# Patient Record
Sex: Female | Born: 1965 | Race: White | Hispanic: No | Marital: Married | Smoking: Never smoker
Health system: Southern US, Community
[De-identification: ages and names within clinical notes are randomized; demographics above are authoritative.]

## PROBLEM LIST (undated history)

## (undated) DIAGNOSIS — F32A Depression, unspecified: Secondary | ICD-10-CM

## (undated) DIAGNOSIS — D259 Leiomyoma of uterus, unspecified: Secondary | ICD-10-CM

## (undated) DIAGNOSIS — Z8742 Personal history of other diseases of the female genital tract: Secondary | ICD-10-CM

## (undated) DIAGNOSIS — I10 Essential (primary) hypertension: Secondary | ICD-10-CM

## (undated) DIAGNOSIS — N840 Polyp of corpus uteri: Secondary | ICD-10-CM

## (undated) DIAGNOSIS — L309 Dermatitis, unspecified: Secondary | ICD-10-CM

## (undated) DIAGNOSIS — G43909 Migraine, unspecified, not intractable, without status migrainosus: Secondary | ICD-10-CM

## (undated) DIAGNOSIS — F329 Major depressive disorder, single episode, unspecified: Secondary | ICD-10-CM

## (undated) HISTORY — DX: Major depressive disorder, single episode, unspecified: F32.9

## (undated) HISTORY — DX: Dermatitis, unspecified: L30.9

## (undated) HISTORY — DX: Depression, unspecified: F32.A

---

## 1988-07-23 HISTORY — PX: TONSILLECTOMY: SUR1361

## 1994-07-23 HISTORY — PX: APPENDECTOMY: SHX54

## 2002-07-23 DIAGNOSIS — D239 Other benign neoplasm of skin, unspecified: Secondary | ICD-10-CM

## 2002-07-23 HISTORY — DX: Other benign neoplasm of skin, unspecified: D23.9

## 2011-09-20 DIAGNOSIS — F329 Major depressive disorder, single episode, unspecified: Secondary | ICD-10-CM | POA: Insufficient documentation

## 2011-09-20 DIAGNOSIS — F32A Depression, unspecified: Secondary | ICD-10-CM | POA: Insufficient documentation

## 2014-04-06 DIAGNOSIS — L309 Dermatitis, unspecified: Secondary | ICD-10-CM | POA: Insufficient documentation

## 2015-05-18 ENCOUNTER — Encounter: Payer: Self-pay | Admitting: Physician Assistant

## 2015-05-18 ENCOUNTER — Ambulatory Visit (INDEPENDENT_AMBULATORY_CARE_PROVIDER_SITE_OTHER): Payer: BLUE CROSS/BLUE SHIELD | Admitting: Physician Assistant

## 2015-05-18 ENCOUNTER — Other Ambulatory Visit: Payer: Self-pay

## 2015-05-18 VITALS — BP 120/80 | HR 97 | Temp 98.5°F | Resp 16 | Ht 67.0 in | Wt 174.2 lb

## 2015-05-18 DIAGNOSIS — F32A Depression, unspecified: Secondary | ICD-10-CM

## 2015-05-18 DIAGNOSIS — E7800 Pure hypercholesterolemia, unspecified: Secondary | ICD-10-CM

## 2015-05-18 DIAGNOSIS — F329 Major depressive disorder, single episode, unspecified: Secondary | ICD-10-CM

## 2015-05-18 DIAGNOSIS — Z23 Encounter for immunization: Secondary | ICD-10-CM | POA: Diagnosis not present

## 2015-05-18 DIAGNOSIS — L309 Dermatitis, unspecified: Secondary | ICD-10-CM | POA: Diagnosis not present

## 2015-05-18 MED ORDER — VENLAFAXINE HCL ER 75 MG PO CP24: ORAL_CAPSULE | ORAL | Status: DC

## 2015-05-18 MED ORDER — TRIAMCINOLONE ACETONIDE 0.1 % EX OINT: 1.0000 "application " | TOPICAL_OINTMENT | Freq: Two times a day (BID) | CUTANEOUS | Status: DC

## 2015-05-18 NOTE — Progress Notes (Signed)
Patient ID: Kristi Dominguez, female   DOB: [birth year], 49 y.o.   MRN: [id number]  Name: Kristi Dominguez   MRN: [id number]    DOB: 10-01-1965   Date:05/18/2015       Progress Note  Subjective  Chief Complaint  Chief Complaint  Patient presents with  . Establish Care    Patient comes in office today to reestablish patient care, previous PCP was Dr. Hortencia Pilar. Patient request that we order Lipid panel today, she states that last time it was checked it was elevated and she just wants to rule it out given her family history of heart disease.Patient reports that her last pap smear and mammogram are up to date from 2015 and within normal limits.     HPI  Kristi Dominguez is a 49 year old female that comes to the office today to establish care. He states that she was previously seen for physical last year by Dr. Hortencia Pilar and had a normal Pap smear and pelvic exam. She states the only thing that was abnormal was that she did have elevated cholesterol when they checked her blood work. She would like to have her lipid panel checked again today. She does have a strong family history of CAD. She is not currently taking any cholesterol-lowering medication. She also states that she does not adhere to a healthy diet continuously due to schedules.  She would also like refills of her Effexor and her triamcinolone cream. She states that her dermatitis only flares in the winter. She states that she has had this for the last 10 years. The triamcinolone does help with the itch that she gets.  As for her depression she states that she does feel stable. She states that she still does have good days and bad days. She does have a fairly stressful life. Her middle son, Glennon Mac, was recently diagnosed with autism. He has been doing well since diagnosis but they do have good and bad days with him. She also states that her mother is in failing health and that her and her sisters used to try to take care of her. They  recently got home health to come in and help take care of her mother. This is relieved some stress but her mother does still try to get him to come see her at least once a day. She does have 2 other children as well, Apolonio Schneiders and Smackover, that have active lifestyle to produce a patent extracurricular activities. She states they're always busy running to practice and different appointments. This keeps her busy and she states that because of this they do not eat at home often. The fast food and things on the go. This is caused her to gain some weight which she thinks also exacerbates the depression some. She also does not exercise like she would like.  No problem-specific assessment & plan notes found for this encounter.   Past Medical History  Diagnosis Date  . Eczema   . Depression     Past Surgical History  Procedure Laterality Date  . Appendectomy  1996  . Tonsillectomy  1990  . Vaginal delivery  [phone]    Family History  Problem Relation Age of Onset  . Kidney failure Mother   . Hypertension Mother   . Hyperlipidemia Mother   . Heart attack Mother   . Obesity Mother   . Depression Mother   . Heart attack Father   . Hyperlipidemia Father   . Hypertension Father   . Depression  Father     Social History   Social History  . Marital Status: Married    Spouse Name: N/A  . Number of Children: N/A  . Years of Education: N/A   Occupational History  . Not on file.   Social History Main Topics  . Smoking status: Never Smoker   . Smokeless tobacco: Never Used  . Alcohol Use: No  . Drug Use: No  . Sexual Activity: Yes    Birth Control/ Protection: None   Other Topics Concern  . Not on file   Social History Narrative  . No narrative on file     Current outpatient prescriptions:  .  triamcinolone ointment (KENALOG) 0.1 %, Apply 1 application topically 2 (two) times daily., Disp: 80 g, Rfl: 3 .  venlafaxine XR (EFFEXOR-XR) 75 MG 24 hr capsule, TAKE ONE CAPSULE BY  MOUTH DAILY, Disp: 30 capsule, Rfl: 6  Allergies  Allergen Reactions  . Tetracycline Other (See Comments)     Review of Systems  Constitutional: Positive for malaise/fatigue.       Irritability  HENT: Negative.   Eyes: Negative.   Respiratory: Positive for cough. Negative for sputum production, shortness of breath and wheezing.   Cardiovascular: Negative.   Gastrointestinal: Negative.   Genitourinary: Negative.   Musculoskeletal: Negative.   Skin: Negative.   Neurological: Negative.   Endo/Heme/Allergies: Positive for environmental allergies.  Psychiatric/Behavioral: The patient is nervous/anxious and has insomnia.     Objective  Filed Vitals:   05/18/15 1434  BP: 120/80  Pulse: 97  Temp: 98.5 F (36.9 C)  TempSrc: Oral  Resp: 16  Height: 5\' 7"  (1.702 m)  Weight: 174 lb 3.2 oz (79.017 kg)    Physical Exam  Constitutional: She is oriented to person, place, and time and well-developed, well-nourished, and in no distress. No distress.  HENT:  Head: Normocephalic and atraumatic.  Right Ear: Hearing, tympanic membrane, external ear and ear canal normal.  Left Ear: Hearing, tympanic membrane, external ear and ear canal normal.  Nose: Nose normal.  Mouth/Throat: Uvula is midline, oropharynx is clear and moist and mucous membranes are normal. No oropharyngeal exudate.  Eyes: Conjunctivae are normal. Pupils are equal, round, and reactive to light. Right eye exhibits no discharge. Left eye exhibits no discharge. No scleral icterus.  Neck: Normal range of motion. Neck supple. No JVD present. No tracheal deviation present. No thyromegaly present.  Cardiovascular: Normal rate, regular rhythm, normal heart sounds and intact distal pulses.  Exam reveals no gallop and no friction rub.   No murmur heard. Pulmonary/Chest: Effort normal and breath sounds normal. No respiratory distress. She has no wheezes. She has no rales.  Musculoskeletal: Normal range of motion.  Lymphadenopathy:     She has no cervical adenopathy.  Neurological: She is alert and oriented to person, place, and time.  Skin: Skin is warm and dry. No rash noted. She is not diaphoretic.  Psychiatric: Mood, memory, affect and judgment normal.  Vitals reviewed.   No results found for this or any previous visit (from the past 2160 hour(s)).   Assessment & Plan  Problem List Items Addressed This Visit      Musculoskeletal and Integument   Dermatitis, eczematoid   Relevant Medications   triamcinolone ointment (KENALOG) 0.1 %   Other Relevant Orders   Comprehensive Metabolic Panel (CMET)     Other   Clinical depression   Relevant Medications   venlafaxine XR (EFFEXOR-XR) 75 MG 24 hr capsule   Other Relevant  Orders   TSH   CBC with Differential   Comprehensive Metabolic Panel (CMET)   Hypercholesterolemia   Relevant Orders   Lipid panel   Comprehensive Metabolic Panel (CMET)    Other Visit Diagnoses    Need for influenza vaccination    -  Primary    Relevant Orders    Flu Vaccine QUAD 36+ mos IM (Completed)       Meds ordered this encounter  Medications  . DISCONTD: venlafaxine XR (EFFEXOR-XR) 75 MG 24 hr capsule    Sig: Take by mouth.  . DISCONTD: triamcinolone ointment (KENALOG) 0.1 %    Sig: Apply 1 application topically 2 (two) times daily.  Marland Kitchen triamcinolone ointment (KENALOG) 0.1 %    Sig: Apply 1 application topically 2 (two) times daily.    Dispense:  80 g    Refill:  3  . venlafaxine XR (EFFEXOR-XR) 75 MG 24 hr capsule    Sig: TAKE ONE CAPSULE BY MOUTH DAILY    Dispense:  30 capsule    Refill:  6   Refilled her medications as above. We will also recheck cholesterol, CBC, CMP and thyroid. I will call her with the results of these labs. I will follow-up with her pending lab results or if all labs are stable I will follow-up with her in 6 months for recheck of her cholesterol and depression.

## 2015-05-18 NOTE — Patient Instructions (Signed)

## 2015-05-31 LAB — CBC WITH DIFFERENTIAL/PLATELET
BASOS: 1 %
Basophils Absolute: 0 10*3/uL (ref 0.0–0.2)
EOS (ABSOLUTE): 0.2 10*3/uL (ref 0.0–0.4)
EOS: 4 %
HEMATOCRIT: 41 % (ref 34.0–46.6)
HEMOGLOBIN: 13.9 g/dL (ref 11.1–15.9)
IMMATURE GRANULOCYTES: 0 %
Immature Grans (Abs): 0 10*3/uL (ref 0.0–0.1)
LYMPHS ABS: 1.5 10*3/uL (ref 0.7–3.1)
Lymphs: 34 %
MCH: 30.7 pg (ref 26.6–33.0)
MCHC: 33.9 g/dL (ref 31.5–35.7)
MCV: 91 fL (ref 79–97)
MONOCYTES: 9 %
Monocytes Absolute: 0.4 10*3/uL (ref 0.1–0.9)
Neutrophils Absolute: 2.3 10*3/uL (ref 1.4–7.0)
Neutrophils: 52 %
Platelets: 237 10*3/uL (ref 150–379)
RBC: 4.53 x10E6/uL (ref 3.77–5.28)
RDW: 13.7 % (ref 12.3–15.4)
WBC: 4.3 10*3/uL (ref 3.4–10.8)

## 2015-05-31 LAB — TSH: TSH: 2.04 u[IU]/mL (ref 0.450–4.500)

## 2015-05-31 LAB — COMPREHENSIVE METABOLIC PANEL
A/G RATIO: 1.6 (ref 1.1–2.5)
ALBUMIN: 3.9 g/dL (ref 3.5–5.5)
ALT: 9 IU/L (ref 0–32)
AST: 14 IU/L (ref 0–40)
Alkaline Phosphatase: 50 IU/L (ref 39–117)
BUN / CREAT RATIO: 18 (ref 9–23)
BUN: 14 mg/dL (ref 6–24)
Bilirubin Total: 0.2 mg/dL (ref 0.0–1.2)
CALCIUM: 8.9 mg/dL (ref 8.7–10.2)
CO2: 23 mmol/L (ref 18–29)
CREATININE: 0.77 mg/dL (ref 0.57–1.00)
Chloride: 104 mmol/L (ref 97–106)
GFR calc Af Amer: 106 mL/min/{1.73_m2} (ref >59)
GFR, EST NON AFRICAN AMERICAN: 92 mL/min/{1.73_m2} (ref >59)
GLOBULIN, TOTAL: 2.4 g/dL (ref 1.5–4.5)
Glucose: 94 mg/dL (ref 65–99)
Potassium: 4.6 mmol/L (ref 3.5–5.2)
SODIUM: 141 mmol/L (ref 136–144)
Total Protein: 6.3 g/dL (ref 6.0–8.5)

## 2015-05-31 LAB — LIPID PANEL
CHOLESTEROL TOTAL: 257 mg/dL — AB (ref 100–199)
Chol/HDL Ratio: 4.4 ratio units (ref 0.0–4.4)
HDL: 59 mg/dL (ref >39)
LDL CALC: 180 mg/dL — AB (ref 0–99)
TRIGLYCERIDES: 88 mg/dL (ref 0–149)
VLDL CHOLESTEROL CAL: 18 mg/dL (ref 5–40)

## 2015-06-01 ENCOUNTER — Telehealth: Payer: Self-pay

## 2015-06-01 NOTE — Telephone Encounter (Signed)
-----   Message from Mar Daring, PA-C sent at 05/31/2015  8:10 AM EST ----- Total cholesterol and LDL (bad) cholesterol are still elevated.  HDL (good) cholesterol is also elevated at 59 which offers some cardioprotection.  There is not a need for cholesterol lowering prescription medication at this time because of the HDL level.  However you may benefit from taking fish oil.  If you would like you could take 1200mg  fish oil twice daily.  Also continue physical activity.  This helps with keeping the HDL level elevated.  All other labs are stable and WNL.  We will recheck in one year.

## 2015-06-01 NOTE — Telephone Encounter (Signed)
Patient advised as directed below.  Thanks,  -Macil Crady 

## 2015-06-24 ENCOUNTER — Ambulatory Visit (INDEPENDENT_AMBULATORY_CARE_PROVIDER_SITE_OTHER): Payer: BLUE CROSS/BLUE SHIELD | Admitting: Family Medicine

## 2015-06-24 ENCOUNTER — Encounter: Payer: Self-pay | Admitting: Family Medicine

## 2015-06-24 VITALS — BP 114/80 | HR 73 | Temp 98.5°F | Resp 16 | Wt 174.2 lb

## 2015-06-24 DIAGNOSIS — R202 Paresthesia of skin: Secondary | ICD-10-CM | POA: Diagnosis not present

## 2015-06-24 NOTE — Patient Instructions (Signed)
Discussed taking two Aleve twice daily with food and a cockup wrist splint.

## 2015-06-24 NOTE — Progress Notes (Signed)
Subjective:     Patient ID: Kristi Dominguez, female   DOB: [birth year], 49 y.o.   MRN: [id number]  HPI  Chief Complaint  Patient presents with  . Numbness    Patient comes in office today with concerns with numbness in her left arm. Patient reports she began to experience numbness and tingling a week ago starting in her left hand at her 4th and 5th finger and radiating up fore arm. Patient reports that she did have a history of carpal tunnel in her hand.   States she is a full time mom of 3 children with the usual household activities. Denies specific repetitive motion injury she may have incurred. Does report increased stress as her 49 year old son was recently diagnosed with autism. Does not wish to change or add medication for stress at this time.   Review of Systems  Musculoskeletal:       No decreased strength in left hand.       Objective:   Physical Exam  Constitutional: She appears well-developed and well-nourished. No distress.  Musculoskeletal:  Grip strength 5/5 symmetrically. + Phalen's test for increased tingling in fourth and fifth fingers.       Assessment:    1. Paresthesias in left hand: probable ulnar tunnel syndrome    Plan:    Discussed use of  nsaid's and a cockup wrist splint.

## 2015-11-16 ENCOUNTER — Ambulatory Visit: Payer: BLUE CROSS/BLUE SHIELD | Admitting: Physician Assistant

## 2015-11-17 ENCOUNTER — Ambulatory Visit (INDEPENDENT_AMBULATORY_CARE_PROVIDER_SITE_OTHER): Payer: BLUE CROSS/BLUE SHIELD | Admitting: Physician Assistant

## 2015-11-17 ENCOUNTER — Encounter: Payer: Self-pay | Admitting: Physician Assistant

## 2015-11-17 VITALS — BP 120/70 | HR 73 | Temp 98.3°F | Resp 16 | Wt 173.4 lb

## 2015-11-17 DIAGNOSIS — E7800 Pure hypercholesterolemia, unspecified: Secondary | ICD-10-CM | POA: Diagnosis not present

## 2015-11-17 DIAGNOSIS — F329 Major depressive disorder, single episode, unspecified: Secondary | ICD-10-CM

## 2015-11-17 DIAGNOSIS — F32A Depression, unspecified: Secondary | ICD-10-CM

## 2015-11-17 MED ORDER — VENLAFAXINE HCL ER 75 MG PO CP24: ORAL_CAPSULE | ORAL | Status: DC

## 2015-11-17 NOTE — Progress Notes (Signed)
Patient: Kristi Dominguez Female    DOB: 07-May-1966   50 y.o.   MRN: [id number] Visit Date: 11/17/2015  Today's Provider: Mar Daring, PA-C   Chief Complaint  Patient presents with  . Follow-up    Depression and Hypercholesterolemia   Subjective:    HPI Depression: Patient here for 6 months follow-up depression. She complains of depressed mood sometimes and some Insomnia. She denies current suicidal and homicidal plan or intent. She complains of the following side effects from the treatment: none. Overall feels that increased exercising makes her feel better and sleep better. Current stressors remain associated with family issues. She stays busy with her oldest and youngest children and their extracurricular activities. Her middle son is autistic and OCD. They are in search of a developmental pediatrician.    Lipid/Cholesterol, Follow-up:   Last seen for this6 months ago.  Management changes since that visit include take fish oil twice daily. . Last Lipid Panel:    Component Value Date/Time   CHOL 257* 05/30/2015 0824   TRIG 88 05/30/2015 0824   HDL 59 05/30/2015 0824   CHOLHDL 4.4 05/30/2015 0824   LDLCALC 180* 05/30/2015 0824    She reports n/a compliance with treatment. She didn't do the fish oil. She is not having side effects.  Current symptoms include none. Weight trend: stable Prior visit with dietician: no Current diet: well balanced She doesn't drink any more sodas or sweet tea. Snacks are veggies, bars,or fruits. 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 She is a little concern that when she is exercising (jumping) she needs to wear a pad because her urine comes out.  Wt Readings from Last 3 Encounters:  11/17/15 173 lb 6.4 oz (78.654 kg)  06/24/15 174 lb 3.2 oz (79.017 kg)  05/18/15 174 lb 3.2 oz (79.017 kg)    -------------------------------------------------------------------     Allergies  Allergen  Reactions  . Tetracycline Other (See Comments)   Previous Medications   CETIRIZINE (ZYRTEC) 10 MG TABLET    Take 10 mg by mouth daily.   PROBIOTIC PRODUCT (TRUBIOTICS PO)    Take by mouth daily.   TRIAMCINOLONE OINTMENT (KENALOG) 0.1 %    Apply 1 application topically 2 (two) times daily.   VENLAFAXINE XR (EFFEXOR-XR) 75 MG 24 HR CAPSULE    TAKE ONE CAPSULE BY MOUTH DAILY    Review of Systems  Constitutional: Negative for fever and fatigue.  Respiratory: Negative for cough, chest tightness and shortness of breath.   Cardiovascular: Negative for chest pain, palpitations and leg swelling.  Gastrointestinal: Negative for nausea, vomiting and abdominal pain.  Endocrine: Positive for heat intolerance (from the Effexor).  Neurological: Negative for dizziness, light-headedness and headaches.  Psychiatric/Behavioral: Negative for suicidal ideas, sleep disturbance, self-injury, dysphoric mood and decreased concentration. The patient is not nervous/anxious.     Social History  Substance Use Topics  . Smoking status: Never Smoker   . Smokeless tobacco: Never Used  . Alcohol Use: No   Objective:   BP 120/70 mmHg  Pulse 73  Temp(Src) 98.3 F (36.8 C) (Oral)  Resp 16  Wt 173 lb 6.4 oz (78.654 kg)  LMP 10/31/2015  Physical Exam  Constitutional: She appears well-developed and well-nourished. No distress.  Neck: Normal range of motion. Neck supple.  Cardiovascular: Normal rate, regular rhythm and normal heart sounds.  Exam reveals no gallop and no friction rub.   No murmur heard. Pulmonary/Chest: Effort normal and  breath sounds normal. No respiratory distress. She has no wheezes. She has no rales.  Skin: She is not diaphoretic.  Psychiatric: She has a normal mood and affect. Her behavior is normal. Judgment and thought content normal.  Vitals reviewed.       Assessment & Plan:     1. Clinical depression Stable. Diagnosis pulled for medication refill. Continue current medical  treatment plan. She is to call if any worsening symptoms, acute issue, questions or concerns arise. - venlafaxine XR (EFFEXOR-XR) 75 MG 24 hr capsule; TAKE ONE CAPSULE BY MOUTH DAILY  Dispense: 30 capsule; Refill: 6  2. Hypercholesterolemia Stable. Improving diet and exercise. Will recheck in 6 months and also get CPE at that time as well.        Mar Daring, PA-C  [facility]

## 2015-11-17 NOTE — Patient Instructions (Signed)

## 2016-02-09 ENCOUNTER — Other Ambulatory Visit: Payer: Self-pay | Admitting: Physician Assistant

## 2016-02-09 DIAGNOSIS — F32A Depression, unspecified: Secondary | ICD-10-CM

## 2016-02-09 DIAGNOSIS — F329 Major depressive disorder, single episode, unspecified: Secondary | ICD-10-CM

## 2016-02-09 MED ORDER — VENLAFAXINE HCL ER 75 MG PO CP24: ORAL_CAPSULE | ORAL | Status: DC

## 2016-05-18 ENCOUNTER — Encounter: Payer: BLUE CROSS/BLUE SHIELD | Admitting: Physician Assistant

## 2016-05-22 ENCOUNTER — Encounter: Payer: Self-pay | Admitting: Physician Assistant

## 2016-05-22 ENCOUNTER — Ambulatory Visit (INDEPENDENT_AMBULATORY_CARE_PROVIDER_SITE_OTHER): Payer: BLUE CROSS/BLUE SHIELD | Admitting: Physician Assistant

## 2016-05-22 VITALS — BP 120/70 | HR 89 | Temp 98.2°F | Resp 16 | Ht 67.0 in | Wt 175.6 lb

## 2016-05-22 DIAGNOSIS — Z1239 Encounter for other screening for malignant neoplasm of breast: Secondary | ICD-10-CM

## 2016-05-22 DIAGNOSIS — F3341 Major depressive disorder, recurrent, in partial remission: Secondary | ICD-10-CM | POA: Diagnosis not present

## 2016-05-22 DIAGNOSIS — Z0000 Encounter for general adult medical examination without abnormal findings: Secondary | ICD-10-CM | POA: Diagnosis not present

## 2016-05-22 DIAGNOSIS — Z124 Encounter for screening for malignant neoplasm of cervix: Secondary | ICD-10-CM

## 2016-05-22 DIAGNOSIS — A09 Infectious gastroenteritis and colitis, unspecified: Secondary | ICD-10-CM

## 2016-05-22 DIAGNOSIS — R110 Nausea: Secondary | ICD-10-CM

## 2016-05-22 DIAGNOSIS — Z1231 Encounter for screening mammogram for malignant neoplasm of breast: Secondary | ICD-10-CM

## 2016-05-22 DIAGNOSIS — E7800 Pure hypercholesterolemia, unspecified: Secondary | ICD-10-CM

## 2016-05-22 DIAGNOSIS — Z23 Encounter for immunization: Secondary | ICD-10-CM | POA: Diagnosis not present

## 2016-05-22 DIAGNOSIS — G43009 Migraine without aura, not intractable, without status migrainosus: Secondary | ICD-10-CM | POA: Diagnosis not present

## 2016-05-22 MED ORDER — ONDANSETRON HCL 4 MG PO TABS: 4.0000 mg | ORAL_TABLET | Freq: Three times a day (TID) | ORAL | 0 refills | Status: DC | PRN

## 2016-05-22 MED ORDER — SUMATRIPTAN SUCCINATE 100 MG PO TABS: 100.0000 mg | ORAL_TABLET | ORAL | 0 refills | Status: DC | PRN

## 2016-05-22 MED ORDER — CIPROFLOXACIN HCL 500 MG PO TABS: 500.0000 mg | ORAL_TABLET | Freq: Every day | ORAL | 0 refills | Status: DC

## 2016-05-22 NOTE — Patient Instructions (Signed)
Health Maintenance, Female Adopting a healthy lifestyle and getting preventive care can go a long way to promote health and wellness. Talk with your health care provider about what schedule of regular examinations is right for you. This is a good chance for you to check in with your provider about disease prevention and staying healthy. In between checkups, there are plenty of things you can do on your own. Experts have done a lot of research about which lifestyle changes and preventive measures are most likely to keep you healthy. Ask your health care provider for more information. WEIGHT AND DIET  Eat a healthy diet  Be sure to include plenty of vegetables, fruits, low-fat dairy products, and lean protein.  Do not eat a lot of foods high in solid fats, added sugars, or salt.  Get regular exercise. This is one of the most important things you can do for your health.  Most adults should exercise for at least 150 minutes each week. The exercise should increase your heart rate and make you sweat (moderate-intensity exercise).  Most adults should also do strengthening exercises at least twice a week. This is in addition to the moderate-intensity exercise.  Maintain a healthy weight  Body mass index (BMI) is a measurement that can be used to identify possible weight problems. It estimates body fat based on height and weight. Your health care provider can help determine your BMI and help you achieve or maintain a healthy weight.  For females 20 years of age and older:   A BMI below 18.5 is considered underweight.  A BMI of 18.5 to 24.9 is normal.  A BMI of 25 to 29.9 is considered overweight.  A BMI of 30 and above is considered obese.  Watch levels of cholesterol and blood lipids  You should start having your blood tested for lipids and cholesterol at 49 years of age, then have this test every 5 years.  You may need to have your cholesterol levels checked more often if:  Your lipid  or cholesterol levels are high.  You are older than 50 years of age.  You are at high risk for heart disease.  CANCER SCREENING   Lung Cancer  Lung cancer screening is recommended for adults 55-80 years old who are at high risk for lung cancer because of a history of smoking.  A yearly low-dose CT scan of the lungs is recommended for people who:  Currently smoke.  Have quit within the past 15 years.  Have at least a 30-pack-year history of smoking. A pack year is smoking an average of one pack of cigarettes a day for 1 year.  Yearly screening should continue until it has been 15 years since you quit.  Yearly screening should stop if you develop a health problem that would prevent you from having lung cancer treatment.  Breast Cancer  Practice breast self-awareness. This means understanding how your breasts normally appear and feel.  It also means doing regular breast self-exams. Let your health care provider know about any changes, no matter how small.  If you are in your 20s or 30s, you should have a clinical breast exam (CBE) by a health care provider every 1-3 years as part of a regular health exam.  If you are 40 or older, have a CBE every year. Also consider having a breast X-ray (mammogram) every year.  If you have a family history of breast cancer, talk to your health care provider about genetic screening.  If you   are at high risk for breast cancer, talk to your health care provider about having an MRI and a mammogram every year.  Breast cancer gene (BRCA) assessment is recommended for women who have family members with BRCA-related cancers. BRCA-related cancers include:  Breast.  Ovarian.  Tubal.  Peritoneal cancers.  Results of the assessment will determine the need for genetic counseling and BRCA1 and BRCA2 testing. Cervical Cancer Your health care provider may recommend that you be screened regularly for cancer of the pelvic organs (ovaries, uterus, and  vagina). This screening involves a pelvic examination, including checking for microscopic changes to the surface of your cervix (Pap test). You may be encouraged to have this screening done every 3 years, beginning at age 21.  For women ages 30-65, health care providers may recommend pelvic exams and Pap testing every 3 years, or they may recommend the Pap and pelvic exam, combined with testing for human papilloma virus (HPV), every 5 years. Some types of HPV increase your risk of cervical cancer. Testing for HPV may also be done on women of any age with unclear Pap test results.  Other health care providers may not recommend any screening for nonpregnant women who are considered low risk for pelvic cancer and who do not have symptoms. Ask your health care provider if a screening pelvic exam is right for you.  If you have had past treatment for cervical cancer or a condition that could lead to cancer, you need Pap tests and screening for cancer for at least 20 years after your treatment. If Pap tests have been discontinued, your risk factors (such as having a new sexual partner) need to be reassessed to determine if screening should resume. Some women have medical problems that increase the chance of getting cervical cancer. In these cases, your health care provider may recommend more frequent screening and Pap tests. Colorectal Cancer  This type of cancer can be detected and often prevented.  Routine colorectal cancer screening usually begins at 50 years of age and continues through 49 years of age.  Your health care provider may recommend screening at an earlier age if you have risk factors for colon cancer.  Your health care provider may also recommend using home test kits to check for hidden blood in the stool.  A small camera at the end of a tube can be used to examine your colon directly (sigmoidoscopy or colonoscopy). This is done to check for the earliest forms of colorectal  cancer.  Routine screening usually begins at age 50.  Direct examination of the colon should be repeated every 5-10 years through 49 years of age. However, you may need to be screened more often if early forms of precancerous polyps or small growths are found. Skin Cancer  Check your skin from head to toe regularly.  Tell your health care provider about any new moles or changes in moles, especially if there is a change in a mole's shape or color.  Also tell your health care provider if you have a mole that is larger than the size of a pencil eraser.  Always use sunscreen. Apply sunscreen liberally and repeatedly throughout the day.  Protect yourself by wearing long sleeves, pants, a wide-brimmed hat, and sunglasses whenever you are outside. HEART DISEASE, DIABETES, AND HIGH BLOOD PRESSURE   High blood pressure causes heart disease and increases the risk of stroke. High blood pressure is more likely to develop in:  People who have blood pressure in the high end   of the normal range (130-139/85-89 mm Hg).  People who are overweight or obese.  People who are African American.  If you are 38-23 years of age, have your blood pressure checked every 3-5 years. If you are 61 years of age or older, have your blood pressure checked every year. You should have your blood pressure measured twice--once when you are at a hospital or clinic, and once when you are not at a hospital or clinic. Record the average of the two measurements. To check your blood pressure when you are not at a hospital or clinic, you can use:  An automated blood pressure machine at a pharmacy.  A home blood pressure monitor.  If you are between 45 years and 39 years old, ask your health care provider if you should take aspirin to prevent strokes.  Have regular diabetes screenings. This involves taking a blood sample to check your fasting blood sugar level.  If you are at a normal weight and have a low risk for diabetes,  have this test once every three years after 49 years of age.  If you are overweight and have a high risk for diabetes, consider being tested at a younger age or more often. PREVENTING INFECTION  Hepatitis B  If you have a higher risk for hepatitis B, you should be screened for this virus. You are considered at high risk for hepatitis B if:  You were born in a country where hepatitis B is common. Ask your health care provider which countries are considered high risk.  Your parents were born in a high-risk country, and you have not been immunized against hepatitis B (hepatitis B vaccine).  You have HIV or AIDS.  You use needles to inject street drugs.  You live with someone who has hepatitis B.  You have had sex with someone who has hepatitis B.  You get hemodialysis treatment.  You take certain medicines for conditions, including cancer, organ transplantation, and autoimmune conditions. Hepatitis C  Blood testing is recommended for:  Everyone born from [birth year] through 1964.  Anyone with known risk factors for hepatitis C. Sexually transmitted infections (STIs)  You should be screened for sexually transmitted infections (STIs) including gonorrhea and chlamydia if:  You are sexually active and are younger than 49 years of age.  You are older than 49 years of age and your health care provider tells you that you are at risk for this type of infection.  Your sexual activity has changed since you were last screened and you are at an increased risk for chlamydia or gonorrhea. Ask your health care provider if you are at risk.  If you do not have HIV, but are at risk, it may be recommended that you take a prescription medicine daily to prevent HIV infection. This is called pre-exposure prophylaxis (PrEP). You are considered at risk if:  You are sexually active and do not regularly use condoms or know the HIV status of your partner(s).  You take drugs by injection.  You are sexually  active with a partner who has HIV. Talk with your health care provider about whether you are at high risk of being infected with HIV. If you choose to begin PrEP, you should first be tested for HIV. You should then be tested every 3 months for as long as you are taking PrEP.  PREGNANCY   If you are premenopausal and you may become pregnant, ask your health care provider about preconception counseling.  If you may  become pregnant, take 400 to 800 micrograms (mcg) of folic acid every day.  If you want to prevent pregnancy, talk to your health care provider about birth control (contraception). OSTEOPOROSIS AND MENOPAUSE   Osteoporosis is a disease in which the bones lose minerals and strength with aging. This can result in serious bone fractures. Your risk for osteoporosis can be identified using a bone density scan.  If you are 50 years of age or older, or if you are at risk for osteoporosis and fractures, ask your health care provider if you should be screened.  Ask your health care provider whether you should take a calcium or vitamin D supplement to lower your risk for osteoporosis.  Menopause may have certain physical symptoms and risks.  Hormone replacement therapy may reduce some of these symptoms and risks. Talk to your health care provider about whether hormone replacement therapy is right for you.  HOME CARE INSTRUCTIONS   Schedule regular health, dental, and eye exams.  Stay current with your immunizations.   Do not use any tobacco products including cigarettes, chewing tobacco, or electronic cigarettes.  If you are pregnant, do not drink alcohol.  If you are breastfeeding, limit how much and how often you drink alcohol.  Limit alcohol intake to no more than 1 drink per day for nonpregnant women. One drink equals 12 ounces of beer, 5 ounces of wine, or 1 ounces of hard liquor.  Do not use street drugs.  Do not share needles.  Ask your health care provider for help if  you need support or information about quitting drugs.  Tell your health care provider if you often feel depressed.  Tell your health care provider if you have ever been abused or do not feel safe at home.   This information is not intended to replace advice given to you by your health care provider. Make sure you discuss any questions you have with your health care provider.   Document Released: 01/22/2011 Document Revised: 07/30/2014 Document Reviewed: 06/10/2013 Elsevier Interactive Patient Education [employer].

## 2016-05-22 NOTE — Progress Notes (Signed)
Patient: Kristi Dominguez, Female    DOB: October 25, 1965, 50 y.o.   MRN: [id number] Visit Date: 05/22/2016  Today's Provider: Mar Daring, PA-C   Chief Complaint  Patient presents with  . Annual Exam   Subjective:    Annual physical exam Kristi Dominguez is a 50 y.o. female who presents today for health maintenance and complete physical. She feels well. She reports exercising. She reports she is sleeping well.  She reports pap 3 years ago at previous PCP and normal. Mammogram: 2015 normal. Uses [facility].  She is traveling with her family to [place] on Jun 03, 2016. She is requesting nausea/diarrhea medicine and any specific immunizations that she might need. -----------------------------------------------------------------   Review of Systems  Constitutional: Negative.        Stressed out  HENT: Negative.   Eyes: Negative.   Respiratory: Positive for cough (persistant;question allergies).   Cardiovascular: Negative.   Gastrointestinal: Negative.   Endocrine: Negative.   Genitourinary: Positive for enuresis (during exercise).  Musculoskeletal: Negative.        Sore spot on Left foot  Skin: Negative.   Allergic/Immunologic: Negative.   Neurological: Negative.   Hematological: Negative.   Psychiatric/Behavioral: Negative.     Social History      She  reports that she has never smoked. She has never used smokeless tobacco. She reports that she does not drink alcohol or use drugs.       Social History   Social History  . Marital status: Married    Spouse name: N/A  . Number of children: N/A  . Years of education: N/A   Social History Main Topics  . Smoking status: Never Smoker  . Smokeless tobacco: Never Used  . Alcohol use No  . Drug use: No  . Sexual activity: Yes    Birth control/ protection: None   Other Topics Concern  . None   Social History Narrative  . None    Past Medical History:  Diagnosis Date  . Depression   .  Eczema      Patient Active Problem List   Diagnosis Date Noted  . Hypercholesterolemia 05/18/2015  . Dermatitis, eczematoid 04/06/2014  . Clinical depression 09/20/2011    Past Surgical History:  Procedure Laterality Date  . APPENDECTOMY  1996  . TONSILLECTOMY  1990  . VAGINAL DELIVERY  2004,2006,2008    Family History        Family Status  Relation Status  . Mother Alive  . Father Alive        Her family history includes Depression in her father and mother; Heart attack in her father and mother; Hyperlipidemia in her father and mother; Hypertension in her father and mother; Kidney failure in her mother; Obesity in her mother.    Allergies  Allergen Reactions  . Tetracycline Other (See Comments)    Current Meds  Medication Sig  . cetirizine (ZYRTEC) 10 MG tablet Take 10 mg by mouth daily.  . Probiotic Product (TRUBIOTICS PO) Take by mouth daily.  Marland Kitchen triamcinolone ointment (KENALOG) 0.1 % Apply 1 application topically 2 (two) times daily.  Marland Kitchen venlafaxine XR (EFFEXOR-XR) 75 MG 24 hr capsule TAKE ONE CAPSULE BY MOUTH DAILY    Patient Care Team: Mar Daring, PA-C as PCP - General (Family Medicine)     Objective:   Vitals: BP 120/70 (BP Location: Right Arm, Patient Position: Sitting, Cuff Size: Normal)   Pulse 89   Temp 98.2  F (36.8 C) (Oral)   Resp 16   Ht 5\' 7"  (1.702 m)   Wt 175 lb 9.6 oz (79.7 kg)   BMI 27.50 kg/m    Physical Exam  Constitutional: She is oriented to person, place, and time. She appears well-developed and well-nourished. No distress.  HENT:  Head: Normocephalic and atraumatic.  Right Ear: Hearing, tympanic membrane, external ear and ear canal normal.  Left Ear: Hearing, tympanic membrane, external ear and ear canal normal.  Nose: Nose normal.  Mouth/Throat: Uvula is midline, oropharynx is clear and moist and mucous membranes are normal. No oropharyngeal exudate.  Eyes: Conjunctivae and EOM are normal. Pupils are equal, round, and  reactive to light. Right eye exhibits no discharge. Left eye exhibits no discharge. No scleral icterus.  Neck: Normal range of motion. Neck supple. No JVD present. Carotid bruit is not present. No tracheal deviation present. No thyromegaly present.  Cardiovascular: Normal rate, regular rhythm, normal heart sounds and intact distal pulses.  Exam reveals no gallop and no friction rub.   No murmur heard. Pulmonary/Chest: Effort normal and breath sounds normal. No respiratory distress. She has no wheezes. She has no rales. She exhibits no tenderness. Right breast exhibits no inverted nipple, no mass, no nipple discharge, no skin change and no tenderness. Left breast exhibits no inverted nipple, no mass, no nipple discharge, no skin change and no tenderness. Breasts are symmetrical.  Abdominal: Soft. Bowel sounds are normal. She exhibits no distension and no mass. There is no tenderness. There is no rebound and no guarding. Hernia confirmed negative in the right inguinal area and confirmed negative in the left inguinal area.  Genitourinary: Rectum normal, vagina normal and uterus normal. No breast swelling, tenderness, discharge or bleeding. Pelvic exam was performed with patient supine. There is no rash, tenderness, lesion or injury on the right labia. There is no rash, tenderness, lesion or injury on the left labia. Cervix exhibits no motion tenderness, no discharge and no friability. Right adnexum displays no mass, no tenderness and no fullness. Left adnexum displays no mass, no tenderness and no fullness. No erythema, tenderness or bleeding in the vagina. No signs of injury around the vagina. No vaginal discharge found.  Musculoskeletal: Normal range of motion. She exhibits no edema or tenderness.  Lymphadenopathy:    She has no cervical adenopathy.       Right: No inguinal adenopathy present.       Left: No inguinal adenopathy present.  Neurological: She is alert and oriented to person, place, and time.  She has normal reflexes. No cranial nerve deficit. Coordination normal.  Skin: Skin is warm and dry. No rash noted. She is not diaphoretic.  Psychiatric: She has a normal mood and affect. Her behavior is normal. Judgment and thought content normal.  Vitals reviewed.   Depression Screen No flowsheet data found.    Assessment & Plan:     Routine Health Maintenance and Physical Exam  Exercise Activities and Dietary recommendations Goals    None      Immunization History  Administered Date(s) Administered  . Influenza,inj,Quad PF,36+ Mos 05/18/2015    Health Maintenance  Topic Date Due  . HIV Screening  05/13/1982  . TETANUS/TDAP  05/13/1986  . PAP SMEAR  05/13/1988  . INFLUENZA VACCINE  02/21/2016      Discussed health benefits of physical activity, and encouraged her to engage in regular exercise appropriate for her age and condition.   1. Annual physical exam Normal physical exam today.  Will check labs as below and f/u pending lab results. If labs are stable and WNL she will not need to have these rechecked for one year at her next annual physical exam. She is to call the office in the meantime if she has any acute issue, questions or concerns. - CBC with Differential/Platelet - Comprehensive metabolic panel - TSH  2. Cervical cancer screening Pap collected today. Will send as below and f/u pending results. - Pap IG and HPV (high risk) DNA detection  3. Breast cancer screening Breast exam today was normal. There is no family history of breast cancer. She does perform regular self breast exams. Mammogram was ordered as below. Advised patient to call [facility] so she may schedule her mammogram at her convenience. - MM DIGITAL SCREENING BILATERAL; Future  4. Hypercholesterolemia Diet controlled. Will check labs as below and f/u pending results. - Lipid panel  5. Recurrent major depressive disorder, in partial remission (HCC) Stable on venlafaxine. Most of  stress stems from family stress.  6. Need for tetanus booster Tdap Vaccine given to patient without complications. Patient sat for 15 minutes after administration and was tolerated well without adverse effects. - Tdap vaccine greater than or equal to 7yo IM  7. Need for influenza vaccination Flu vaccine given today without complication. Patient sat upright for 15 minutes to check for adverse reaction before being released. - Flu Vaccine QUAD 36+ mos PF IM (Fluarix & Fluzone Quad PF)  8. Traveler's diarrhea Prophylaxis cipro from traveler's diarrhea for upcoming trip to [place] given to patient. - ciprofloxacin (CIPRO) 500 MG tablet; Take 1 tablet (500 mg total) by mouth daily with breakfast.  Dispense: 7 tablet; Refill: 0  9. Migraine without aura and without status migrainosus, not intractable H/O migraines. Stable. Diagnosis pulled for medication refill. Continue current medical treatment plan. - SUMAtriptan (IMITREX) 100 MG tablet; Take 1 tablet (100 mg total) by mouth every 2 (two) hours as needed for migraine. May repeat in 2 hours if headache persists or recurs.  Dispense: 10 tablet; Refill: 0  10. Nausea Zofran given for possible nausea from travel. - ondansetron (ZOFRAN) 4 MG tablet; Take 1 tablet (4 mg total) by mouth every 8 (eight) hours as needed for nausea or vomiting.  Dispense: 10 tablet; Refill: 0  --------------------------------------------------------------------    Mar Daring, PA-C  [facility]

## 2016-05-27 LAB — PAP IG AND HPV HIGH-RISK
HPV, high-risk: NEGATIVE
PAP Smear Comment: 0

## 2016-05-28 ENCOUNTER — Telehealth: Payer: Self-pay

## 2016-05-28 NOTE — Telephone Encounter (Signed)
LMTCB-KW 

## 2016-05-28 NOTE — Telephone Encounter (Signed)
Patient has been advised of lab. KW

## 2016-05-28 NOTE — Telephone Encounter (Signed)
-----   Message from Mar Daring, PA-C sent at 05/28/2016  8:43 AM EST ----- Pap is normal and HPV negative. Normally would repeat in 5 years.

## 2016-05-30 ENCOUNTER — Telehealth: Payer: Self-pay

## 2016-05-30 LAB — LIPID PANEL
CHOL/HDL RATIO: 4.1 ratio (ref 0.0–4.4)
Cholesterol, Total: 292 mg/dL — ABNORMAL HIGH (ref 100–199)
HDL: 72 mg/dL (ref >39)
LDL Calculated: 198 mg/dL — ABNORMAL HIGH (ref 0–99)
TRIGLYCERIDES: 109 mg/dL (ref 0–149)
VLDL Cholesterol Cal: 22 mg/dL (ref 5–40)

## 2016-05-30 LAB — COMPREHENSIVE METABOLIC PANEL
A/G RATIO: 1.7 (ref 1.2–2.2)
ALT: 11 IU/L (ref 0–32)
AST: 15 IU/L (ref 0–40)
Albumin: 4.4 g/dL (ref 3.5–5.5)
Alkaline Phosphatase: 56 IU/L (ref 39–117)
BUN/Creatinine Ratio: 18 (ref 9–23)
BUN: 14 mg/dL (ref 6–24)
Bilirubin Total: 0.2 mg/dL (ref 0.0–1.2)
CALCIUM: 9.2 mg/dL (ref 8.7–10.2)
CO2: 25 mmol/L (ref 18–29)
Chloride: 99 mmol/L (ref 96–106)
Creatinine, Ser: 0.77 mg/dL (ref 0.57–1.00)
GFR calc Af Amer: 105 mL/min/{1.73_m2} (ref >59)
GFR, EST NON AFRICAN AMERICAN: 91 mL/min/{1.73_m2} (ref >59)
GLOBULIN, TOTAL: 2.6 g/dL (ref 1.5–4.5)
Glucose: 84 mg/dL (ref 65–99)
POTASSIUM: 4.5 mmol/L (ref 3.5–5.2)
SODIUM: 143 mmol/L (ref 134–144)
Total Protein: 7 g/dL (ref 6.0–8.5)

## 2016-05-30 LAB — CBC WITH DIFFERENTIAL/PLATELET
BASOS: 0 %
Basophils Absolute: 0 10*3/uL (ref 0.0–0.2)
EOS (ABSOLUTE): 0.3 10*3/uL (ref 0.0–0.4)
EOS: 6 %
HEMATOCRIT: 41.5 % (ref 34.0–46.6)
Hemoglobin: 14.3 g/dL (ref 11.1–15.9)
IMMATURE GRANULOCYTES: 0 %
Immature Grans (Abs): 0 10*3/uL (ref 0.0–0.1)
LYMPHS ABS: 2.2 10*3/uL (ref 0.7–3.1)
Lymphs: 43 %
MCH: 31.3 pg (ref 26.6–33.0)
MCHC: 34.5 g/dL (ref 31.5–35.7)
MCV: 91 fL (ref 79–97)
MONOS ABS: 0.5 10*3/uL (ref 0.1–0.9)
Monocytes: 10 %
NEUTROS ABS: 2.1 10*3/uL (ref 1.4–7.0)
Neutrophils: 41 %
Platelets: 258 10*3/uL (ref 150–379)
RBC: 4.57 x10E6/uL (ref 3.77–5.28)
RDW: 13.9 % (ref 12.3–15.4)
WBC: 5.1 10*3/uL (ref 3.4–10.8)

## 2016-05-30 LAB — TSH: TSH: 2.14 u[IU]/mL (ref 0.450–4.500)

## 2016-05-30 NOTE — Telephone Encounter (Signed)
-----   Message from Mar Daring, [place] sent at 05/30/2016  8:09 AM EST ----- All labs are WNL and stable with exception of cholesterol which is elevated. Your good cholesterol has improved from last year however which offers great cardioprotection. Continue healthy lifestyle modifications.

## 2016-05-30 NOTE — Telephone Encounter (Signed)
Patient has been advised. KW 

## 2016-05-30 NOTE — Telephone Encounter (Signed)
LMTCB

## 2016-08-07 ENCOUNTER — Encounter: Payer: Self-pay | Admitting: Physician Assistant

## 2016-08-07 ENCOUNTER — Ambulatory Visit (INDEPENDENT_AMBULATORY_CARE_PROVIDER_SITE_OTHER): Payer: Managed Care, Other (non HMO) | Admitting: Physician Assistant

## 2016-08-07 VITALS — BP 110/70 | HR 77 | Temp 98.0°F | Resp 16 | Wt 181.0 lb

## 2016-08-07 DIAGNOSIS — Z803 Family history of malignant neoplasm of breast: Secondary | ICD-10-CM

## 2016-08-07 DIAGNOSIS — N63 Unspecified lump in unspecified breast: Secondary | ICD-10-CM

## 2016-08-07 NOTE — Patient Instructions (Signed)
Mammogram A mammogram is an X-ray of the breasts that is done to check for abnormal changes. This procedure can screen for and detect any changes that may suggest breast cancer. A mammogram can also identify other changes and variations in the breast, such as:  Inflammation of the breast tissue (mastitis).  An infected area that contains a collection of pus (abscess).  A fluid-filled sac (cyst).  Fibrocystic changes. This is when breast tissue becomes denser, which can make the tissue feel rope-like or uneven under the skin.  Tumors that are not cancerous (benign).  Tell a health care provider about:  Any allergies you have.  If you have breast implants.  If you have had previous breast disease, biopsy, or surgery.  If you are breastfeeding.  Any possibility that you could be pregnant, if this applies.  If you are younger than age 25.  If you have a family history of breast cancer. What are the risks? Generally, this is a safe procedure. However, problems may occur, including:  Exposure to radiation. Radiation levels are very low with this test.  The results being misinterpreted.  The need for further tests.  The inability of the mammogram to detect certain cancers.  What happens before the procedure?  Schedule your test about 1-2 weeks after your menstrual period. This is usually when your breasts are the least tender.  If you have had a mammogram done at a different facility in the past, get the mammogram X-rays or have them sent to your current exam facility in order to compare them.  Wash your breasts and under your arms the day of the test.  Do not wear deodorants, perfumes, lotions, or powders anywhere on your body on the day of the test.  Remove any jewelry from your neck.  Wear clothes that you can change into and out of easily. What happens during the procedure?  You will undress from the waist up and put on a gown.  You will stand in front of the  X-ray machine.  Each breast will be placed between two plastic or glass plates. The plates will compress your breast for a few seconds. Try to stay as relaxed as possible during the procedure. This does not cause any harm to your breasts and any discomfort you feel will be very brief.  X-rays will be taken from different angles of each breast. The procedure may vary among health care providers and hospitals. What happens after the procedure?  The mammogram will be examined by a specialist (radiologist).  You may need to repeat certain parts of the test, depending on the quality of the images. This is commonly done if the radiologist needs a better view of the breast tissue.  Ask when your test results will be ready. Make sure you get your test results.  You may resume your normal activities. This information is not intended to replace advice given to you by your health care provider. Make sure you discuss any questions you have with your health care provider. Document Released: 07/06/2000 Document Revised: 12/12/2015 Document Reviewed: 09/17/2014 Elsevier Interactive Patient Education  2017 Elsevier Inc.  

## 2016-08-07 NOTE — Progress Notes (Signed)
Patient: Kristi Dominguez Female    DOB: May 28, 1966   51 y.o.   MRN: [id number] Visit Date: 08/07/2016  Today's Provider: Mar Daring, PA-C   Chief Complaint  Patient presents with  . Lump on right breast   Subjective:    HPI  Patient is here with c/o lump on the right breast. Requesting mammogram referral. She reports that her breast aches. It doesn't hurt to touch. Symptoms started around Christmas time.  Family history of breast cancer. Denies skin changes, asymmetry, nipple discharge.     Allergies  Allergen Reactions  . Tetracycline Other (See Comments)     Current Outpatient Prescriptions:  .  cetirizine (ZYRTEC) 10 MG tablet, Take 10 mg by mouth daily., Disp: , Rfl:  .  Probiotic Product (TRUBIOTICS PO), Take by mouth daily., Disp: , Rfl:  .  SUMAtriptan (IMITREX) 100 MG tablet, Take 1 tablet (100 mg total) by mouth every 2 (two) hours as needed for migraine. May repeat in 2 hours if headache persists or recurs., Disp: 10 tablet, Rfl: 0 .  triamcinolone ointment (KENALOG) 0.1 %, Apply 1 application topically 2 (two) times daily., Disp: 80 g, Rfl: 3 .  venlafaxine XR (EFFEXOR-XR) 75 MG 24 hr capsule, TAKE ONE CAPSULE BY MOUTH DAILY, Disp: 90 capsule, Rfl: 3 .  ciprofloxacin (CIPRO) 500 MG tablet, Take 1 tablet (500 mg total) by mouth daily with breakfast. (Patient not taking: Reported on 08/07/2016), Disp: 7 tablet, Rfl: 0 .  ondansetron (ZOFRAN) 4 MG tablet, Take 1 tablet (4 mg total) by mouth every 8 (eight) hours as needed for nausea or vomiting. (Patient not taking: Reported on 08/07/2016), Disp: 10 tablet, Rfl: 0  Review of Systems  Constitutional: Negative.   Respiratory: Negative.   Cardiovascular: Negative.   Gastrointestinal: Negative.   Neurological: Negative.   Psychiatric/Behavioral: Negative.     Social History  Substance Use Topics  . Smoking status: Never Smoker  . Smokeless tobacco: Never Used  . Alcohol use No   Objective:   BP  110/70 (BP Location: Right Arm, Patient Position: Sitting, Cuff Size: Normal)   Pulse 77   Temp 98 F (36.7 C) (Oral)   Resp 16   Wt 181 lb (82.1 kg)   BMI 28.35 kg/m   Physical Exam  Constitutional: She appears well-developed and well-nourished. No distress.  Neck: Normal range of motion. Neck supple.  Cardiovascular: Normal rate, regular rhythm and normal heart sounds.  Exam reveals no gallop and no friction rub.   No murmur heard. Pulmonary/Chest: Effort normal and breath sounds normal. No respiratory distress. She has no wheezes. She has no rales. Right breast exhibits mass and tenderness. Right breast exhibits no inverted nipple, no nipple discharge and no skin change. Left breast exhibits no inverted nipple, no mass, no nipple discharge, no skin change and no tenderness. Breasts are symmetrical.    Skin: She is not diaphoretic.  Vitals reviewed.      Assessment & Plan:     1. Breast mass in female Small mass noted with family history. Patient is due for regular screening mammogram thus I have ordered diagnostic mammogram bilaterally with [place] of right breast. I will f/u pending results.  - MM Digital Diagnostic Bilat; Future - US BREAST LTD UNI RIGHT INC AXILLA; Future  2. Family history of breast cancer See above medical treatment plan. - MM Digital Diagnostic Bilat; Future - US BREAST LTD UNI RIGHT INC AXILLA; Future  Mar Daring, PA-C  [facility]

## 2016-08-14 ENCOUNTER — Inpatient Hospital Stay
Admission: RE | Admit: 2016-08-14 | Discharge: 2016-08-14 | Disposition: A | Discharge status: Home / Self Care | Payer: Self-pay | Source: Ambulatory Visit | Attending: *Deleted | Admitting: *Deleted

## 2016-08-14 ENCOUNTER — Other Ambulatory Visit: Payer: Self-pay | Admitting: *Deleted

## 2016-08-14 DIAGNOSIS — Z9289 Personal history of other medical treatment: Secondary | ICD-10-CM

## 2016-09-04 ENCOUNTER — Ambulatory Visit
Admission: RE | Admit: 2016-09-04 | Discharge: 2016-09-04 | Disposition: A | Discharge status: Home / Self Care | Payer: Managed Care, Other (non HMO) | Source: Ambulatory Visit | Attending: Physician Assistant | Admitting: Physician Assistant

## 2016-09-04 ENCOUNTER — Other Ambulatory Visit: Payer: Self-pay | Admitting: Physician Assistant

## 2016-09-04 DIAGNOSIS — N6311 Unspecified lump in the right breast, upper outer quadrant: Secondary | ICD-10-CM | POA: Diagnosis not present

## 2016-09-04 DIAGNOSIS — N63 Unspecified lump in unspecified breast: Secondary | ICD-10-CM

## 2016-09-04 DIAGNOSIS — Z803 Family history of malignant neoplasm of breast: Secondary | ICD-10-CM

## 2017-02-23 ENCOUNTER — Other Ambulatory Visit: Payer: Self-pay | Admitting: Physician Assistant

## 2017-02-23 DIAGNOSIS — F32A Depression, unspecified: Secondary | ICD-10-CM

## 2017-02-23 DIAGNOSIS — F329 Major depressive disorder, single episode, unspecified: Secondary | ICD-10-CM

## 2017-03-07 ENCOUNTER — Emergency Department: Payer: 59

## 2017-03-07 ENCOUNTER — Telehealth: Payer: Self-pay

## 2017-03-07 ENCOUNTER — Encounter: Payer: Self-pay | Admitting: Emergency Medicine

## 2017-03-07 ENCOUNTER — Emergency Department
Admission: EM | Admit: 2017-03-07 | Discharge: 2017-03-07 | Disposition: A | Discharge status: Home / Self Care | Payer: 59 | Attending: Emergency Medicine | Admitting: Emergency Medicine

## 2017-03-07 DIAGNOSIS — R079 Chest pain, unspecified: Secondary | ICD-10-CM | POA: Insufficient documentation

## 2017-03-07 DIAGNOSIS — Z79899 Other long term (current) drug therapy: Secondary | ICD-10-CM | POA: Insufficient documentation

## 2017-03-07 LAB — BASIC METABOLIC PANEL
ANION GAP: 8 (ref 5–15)
BUN: 14 mg/dL (ref 6–20)
CHLORIDE: 102 mmol/L (ref 101–111)
CO2: 25 mmol/L (ref 22–32)
Calcium: 9.1 mg/dL (ref 8.9–10.3)
Creatinine, Ser: 0.87 mg/dL (ref 0.44–1.00)
GFR calc Af Amer: >60 mL/min (ref >60)
GFR calc non Af Amer: >60 mL/min (ref >60)
Glucose, Bld: 122 mg/dL — ABNORMAL HIGH (ref 65–99)
POTASSIUM: 3.4 mmol/L — AB (ref 3.5–5.1)
SODIUM: 135 mmol/L (ref 135–145)

## 2017-03-07 LAB — CBC
HEMATOCRIT: 42.1 % (ref 35.0–47.0)
HEMOGLOBIN: 14.5 g/dL (ref 12.0–16.0)
MCH: 31.3 pg (ref 26.0–34.0)
MCHC: 34.6 g/dL (ref 32.0–36.0)
MCV: 90.5 fL (ref 80.0–100.0)
Platelets: 246 10*3/uL (ref 150–440)
RBC: 4.65 MIL/uL (ref 3.80–5.20)
RDW: 12.9 % (ref 11.5–14.5)
WBC: 6.1 10*3/uL (ref 3.6–11.0)

## 2017-03-07 LAB — TROPONIN I: Troponin I: <0.03 ng/mL (ref <0.03)

## 2017-03-07 MED ORDER — ASPIRIN 81 MG PO CHEW
324.0000 mg | CHEWABLE_TABLET | Freq: Once | ORAL | Status: AC
  Administered 2017-03-07: 324 mg via ORAL
  Filled 2017-03-07: qty 4

## 2017-03-07 NOTE — Discharge Instructions (Signed)

## 2017-03-07 NOTE — ED Triage Notes (Signed)
Pt reports an episode of central chest pain that radiated to back today. Pt denies pain at present. Pt states this has happened a couple of times before but did not have it evaluated. Pt reports no associated symptoms.

## 2017-03-07 NOTE — Telephone Encounter (Signed)
Patient has had a lot of stress recently but I do recommend ER as well due to acuity.

## 2017-03-07 NOTE — Telephone Encounter (Signed)
Patient called reporting that she was having chest pain and heaviness feeling today around 12 noon. Patient reports pain did radiate to her back denies pain radiating to her arm, denies and shortness of breath. Patient reports pain was high in her chest area. Patient reports she was driving towards [facility] with her kids and at one point the pain got really bad, she had to pull over and called 911. Patient reports that she refused to wait on EMS and decided to call her husband who is now driving her back to [place]. Patient sounded very concerned and said she felt "embarrassed".  I advised patient to go on to the ER so that they could work her up with labs and have the results ready for her today. Patient agreed to go to the ER. CB# [phone] [phone]. sd

## 2017-03-07 NOTE — ED Provider Notes (Signed)
[facility] Emergency Department Provider Note  ____________________________________________   First MD Initiated Contact with Patient 03/07/17 1628     (approximate)  I have reviewed the triage vital signs and the nursing notes.   HISTORY  Chief Complaint Chest Pain    HPI Kristi Dominguez is a 51 y.o. female who is generally healthy with no significant chronic medical issues who presents for evaluation of acute onset severe central chest pain that radiates through to the back.  This occurred approximately 3-4 hours prior to my evaluation of her.  She was in the process of driving to [facility] for a family activity when she felt the pain develop slowly but rapidly.  She did not feel any associated shortness of breath but the pain was severe enough that she was crying out and had to pull over the car.  It subsided on its own and is completely gone away at this time.  She is intermittently felt similar but much milder symptoms in the past.  Nothing in particular makes the patient's symptoms better nor worse.  She states that she has had panic or anxiety attacks in the past but the pain generally feels different.  She does admit that she is under a great deal of family and social stress at the moment and is not certain if that may contribute.  She has no personal cardiac history.She does not have high blood pressure, diabetes, high cholesterol, nor tobacco history.  Both parents have had CABGs.  She and her family did make a driving trip to [place] relatively recently but she has had no swelling or pain in either of her legs.  She has no personal history of blood clots to her legs nor lungs and she takes no exogenous estrogen.  She has had no numbness, weakness, nor tingling in any of her extremities.   Past Medical History:  Diagnosis Date  . Depression   . Eczema     Patient Active Problem List   Diagnosis Date Noted  . Hypercholesterolemia 05/18/2015  .  Dermatitis, eczematoid 04/06/2014  . Clinical depression 09/20/2011    Past Surgical History:  Procedure Laterality Date  . APPENDECTOMY  1996  . TONSILLECTOMY  1990  . VAGINAL DELIVERY  [phone]    Prior to Admission medications   Medication Sig Start Date End Date Taking? Authorizing Provider  cetirizine (ZYRTEC) 10 MG tablet Take 10 mg by mouth daily.    [provider]  ciprofloxacin (CIPRO) 500 MG tablet Take 1 tablet (500 mg total) by mouth daily with breakfast. Patient not taking: Reported on 08/07/2016 05/22/16   Mar Daring, PA-C  ondansetron (ZOFRAN) 4 MG tablet Take 1 tablet (4 mg total) by mouth every 8 (eight) hours as needed for nausea or vomiting. Patient not taking: Reported on 08/07/2016 05/22/16   Mar Daring, PA-C  Probiotic Product (TRUBIOTICS PO) Take by mouth daily.    [provider]  SUMAtriptan (IMITREX) 100 MG tablet Take 1 tablet (100 mg total) by mouth every 2 (two) hours as needed for migraine. May repeat in 2 hours if headache persists or recurs. 05/22/16   Mar Daring, PA-C  triamcinolone ointment (KENALOG) 0.1 % Apply 1 application topically 2 (two) times daily. 05/18/15   Mar Daring, PA-C  venlafaxine XR (EFFEXOR-XR) 75 MG 24 hr capsule TAKE ONE CAPSULE BY MOUTH DAILY 02/25/17   Mar Daring, PA-C    Allergies Tetracycline  Family History  Problem Relation Age  of Onset  . Kidney failure Mother   . Hypertension Mother   . Hyperlipidemia Mother   . Heart attack Mother   . Obesity Mother   . Depression Mother   . Heart attack Father   . Hyperlipidemia Father   . Hypertension Father   . Depression Father   . Breast cancer Paternal Aunt 78    Social History Social History  Substance Use Topics  . Smoking status: Never Smoker  . Smokeless tobacco: Never Used  . Alcohol use No    Review of Systems Constitutional: No fever/chills Eyes: No visual changes. ENT: No sore  throat. Cardiovascular: +chest pain. Respiratory: Denies shortness of breath. Gastrointestinal: No abdominal pain.  No nausea, no vomiting.  No diarrhea.  No constipation. Genitourinary: Negative for dysuria. Musculoskeletal: Negative for neck pain.  Negative for back pain. Integumentary: Negative for rash. Neurological: Negative for headaches, focal weakness or numbness.   ____________________________________________   PHYSICAL EXAM:  VITAL SIGNS: ED Triage Vitals [03/07/17 1419]  Enc Vitals Group     BP (!) 151/85     Pulse Rate 75     Resp 18     Temp 98 F (36.7 C)     Temp Source Oral     SpO2 100 %     Weight 83.9 kg (185 lb)     Height 1.702 m (5\' 7" )     Head Circumference      Peak Flow      Pain Score 0     Pain Loc      Pain Edu?      Excl. in [place]?     Constitutional: Alert and oriented. Well appearing and in no acute distress. Eyes: Conjunctivae are normal.  Head: Atraumatic. Nose: No congestion/rhinnorhea. Mouth/Throat: Mucous membranes are moist. Neck: No stridor.  No meningeal signs.   Cardiovascular: Normal rate, regular rhythm. Good peripheral circulation. Grossly normal heart sounds. Respiratory: Normal respiratory effort.  No retractions. Lungs CTAB. Gastrointestinal: Soft and nontender. No distention.  Musculoskeletal: No lower extremity tenderness nor edema. No gross deformities of extremities. Neurologic:  Normal speech and language. No gross focal neurologic deficits are appreciated.  Skin:  Skin is warm, dry and intact. No rash noted. Psychiatric: Mood and affect are normal. Speech and behavior are normal.  She did become tearful when talking about all the stress going on in her life at the moment  ____________________________________________   LABS (all labs ordered are listed, but only abnormal results are displayed)  Labs Reviewed  BASIC METABOLIC PANEL - Abnormal; Notable for the following:       Result Value   Potassium 3.4 (*)     Glucose, Bld 122 (*)    All other components within normal limits  CBC  TROPONIN I  TROPONIN I   ____________________________________________  EKG  ED ECG REPORT I, Alicen Donalson, the attending physician, personally viewed and interpreted this ECG.  Date: 03/07/2017 EKG Time: 14:16 Rate: 71 Rhythm: normal sinus rhythm QRS Axis: normal Intervals: normal ST/T Wave abnormalities: normal Narrative Interpretation: unremarkable  ____________________________________________  RADIOLOGY   Dg Chest 2 View  Result Date: 03/07/2017 CLINICAL DATA:  Mid chest pain EXAM: CHEST  2 VIEW COMPARISON:  None. FINDINGS: No active infiltrate or effusion is seen. Mediastinal and hilar contours are unremarkable. The heart is within normal limits in size. No bony abnormality is seen. IMPRESSION: No active cardiopulmonary disease. Electronically Signed   By: Ivar Drape M.D.   On: 03/07/2017 14:49  ____________________________________________   PROCEDURES  Critical Care performed: No   Procedure(s) performed:   Procedures   ____________________________________________   INITIAL IMPRESSION / ASSESSMENT AND PLAN / ED COURSE  Pertinent labs & imaging results that were available during my care of the patient were reviewed by me and considered in my medical decision making (see chart for details).  She is low risk for ACS based on HEART score, and she is PERC negative.  There are no signs or symptoms to suggest aortic pathology such as dissection.  I explained to her that I do not know exactly what is causing her symptoms but that does not seem to be an acute or emergent cause at this time, and I did agree that panic attacks or anxiety may be playing a role.  We agreed to check a second troponin and then reassess and she is comfortable with the plan for outpatient follow-up.  I think that is appropriate given her low risk for any life-threatening causes of chest pain.  I will reassess when her  second troponin is back.  Clinical Course as of Mar 07 2056  Thu Mar 07, 2017  4166 Patient remains asymptomatic and the second troponin was negative.  We had another talk about the importance of outpatient follow-up and she still wants to go home.  I think this is appropriate.  I gave my usual and customary return precautions.     [CF]    Clinical Course User Index [CF] Hinda Kehr, MD    ____________________________________________  FINAL CLINICAL IMPRESSION(S) / ED DIAGNOSES  Final diagnoses:  Chest pain, unspecified type     MEDICATIONS GIVEN DURING THIS VISIT:  Medications  aspirin chewable tablet 324 mg (324 mg Oral Given 03/07/17 1853)     NEW OUTPATIENT MEDICATIONS STARTED DURING THIS VISIT:  Discharge Medication List as of 03/07/2017  6:43 PM      Discharge Medication List as of 03/07/2017  6:43 PM      Discharge Medication List as of 03/07/2017  6:43 PM       Note:  This document was prepared using Dragon voice recognition software and may include unintentional dictation errors.    Hinda Kehr, MD 03/07/17 [phone]

## 2017-03-21 ENCOUNTER — Ambulatory Visit (INDEPENDENT_AMBULATORY_CARE_PROVIDER_SITE_OTHER): Payer: 59 | Admitting: Physician Assistant

## 2017-03-21 ENCOUNTER — Encounter: Payer: Self-pay | Admitting: Physician Assistant

## 2017-03-21 VITALS — BP 120/80 | HR 80 | Temp 99.0°F | Resp 16 | Wt 182.8 lb

## 2017-03-21 DIAGNOSIS — F419 Anxiety disorder, unspecified: Secondary | ICD-10-CM | POA: Diagnosis not present

## 2017-03-21 DIAGNOSIS — E7800 Pure hypercholesterolemia, unspecified: Secondary | ICD-10-CM | POA: Diagnosis not present

## 2017-03-21 DIAGNOSIS — R079 Chest pain, unspecified: Secondary | ICD-10-CM | POA: Diagnosis not present

## 2017-03-21 DIAGNOSIS — F3341 Major depressive disorder, recurrent, in partial remission: Secondary | ICD-10-CM

## 2017-03-21 NOTE — Patient Instructions (Signed)
10 Relaxation Techniques That Zap Stress Fast By Jeannette Moninger   Listen  Relax. You deserve it, it's good for you, and it takes less time than you think. You don't need a spa weekend or a retreat. Each of these stress-relieving tips can get you from OMG to om in less than 15 minutes. 1. Meditate  A few minutes of practice per day can help ease anxiety. "Research suggests that daily meditation may alter the brain's neural pathways, making you more resilient to stress," says psychologist Robbie Maller Hartman, PhD, a [place] health and wellness coach. It's simple. Sit up straight with both feet on the floor. Close your eyes. Focus your attention on reciting -- out loud or silently -- a positive mantra such as "I feel at peace" or "I love myself." Place one hand on your belly to sync the mantra with your breaths. Let any distracting thoughts float by like clouds. 2. Breathe Deeply  Take a 5-minute break and focus on your breathing. Sit up straight, eyes closed, with a hand on your belly. Slowly inhale through your nose, feeling the breath start in your abdomen and work its way to the top of your head. Reverse the process as you exhale through your mouth.  "Deep breathing counters the effects of stress by slowing the heart rate and lowering blood pressure," psychologist Judith Tutin, PhD, says. She's a certified life coach in [place], [place] 3. Be Present  Slow down.  "Take 5 minutes and focus on only one behavior with awareness," Tutin says. Notice how the air feels on your face when you're walking and how your feet feel hitting the ground. Enjoy the texture and taste of each bite of food. When you spend time in the moment and focus on your senses, you should feel less tense. 4. Reach Out  Your social network is one of your best tools for handling stress. Talk to others -- preferably face to face, or at least on the phone. Share what's going on. You can get a fresh perspective while keeping your  connection strong. 5. Tune In to Your Body  Mentally scan your body to get a sense of how stress affects it each day. Lie on your back, or sit with your feet on the floor. Start at your toes and work your way up to your scalp, noticing how your body feels.  10 Relaxation Techniques That Zap Stress Fast By Jeannette Moninger   Listen  "Simply be aware of places you feel tight or loose without trying to change anything," Tutin says. For 1 to 2 minutes, imagine each deep breath flowing to that body part. Repeat this process as you move your focus up your body, paying close attention to sensations you feel in each body part. 6. Decompress  Place a warm heat wrap around your neck and shoulders for 10 minutes. Close your eyes and relax your face, neck, upper chest, and back muscles. Remove the wrap, and use a tennis ball or foam roller to massage away tension.  "Place the ball between your back and the wall. Lean into the ball, and hold gentle pressure for up to 15 seconds. Then move the ball to another spot, and apply pressure," says Cathy Benninger, a nurse practitioner and assistant professor at [employer] [facility] in [place]. 7. Laugh Out Loud  A good belly laugh doesn't just lighten the load mentally. It lowers cortisol, your body's stress hormone, and boosts brain chemicals called endorphins, which help   your mood. Lighten up by tuning in to your favorite sitcom or video, reading the comics, or chatting with someone who makes you smile. 8. Crank Up the Tunes  Research shows that listening to soothing music can lower blood pressure, heart rate, and anxiety. "Create a playlist of songs or nature sounds (the ocean, a bubbling brook, birds chirping), and allow your mind to focus on the different melodies, instruments, or singers in the piece," Benninger says. You also can blow off steam by rocking out to more upbeat tunes -- or singing at the top of your lungs! 9. Get Moving   You don't have to run in order to get a runner's high. All forms of exercise, including yoga and walking, can ease depression and anxiety by helping the brain release feel-good chemicals and by giving your body a chance to practice dealing with stress. You can go for a quick walk around the block, take the stairs up and down a few flights, or do some stretching exercises like head rolls and shoulder shrugs. 10. Be Grateful  Keep a gratitude journal or several (one by your bed, one in your purse, and one at work) to help you remember all the things that are good in your life.  "Being grateful for your blessings cancels out negative thoughts and worries," says Joni Emmerling, a wellness coach in [place], [place].  Use these journals to savor good experiences like a child's smile, a sunshine-filled day, and good health. Don't forget to celebrate accomplishments like mastering a new task at work or a new hobby. When you start feeling stressed, spend a few minutes looking through your notes to remind yourself what really matters.  

## 2017-03-21 NOTE — Progress Notes (Signed)
Patient: Kristi Dominguez Female    DOB: 1966-06-13   51 y.o.   MRN: [id number] Visit Date: 03/21/2017  Today's Provider: Mar Daring, PA-C   Chief Complaint  Patient presents with  . Follow-up   Subjective:    HPI  Follow up ER visit  Patient was seen in ER for chest pain on 03/07/2017. She was treated for unspecified chest pain, possibly anxiety vs GERD. Treatment for this included aspirin 325 mg, labs, ekg and chest x-ray. Work up was normal.  She reports excellent compliance with treatment. She reports this condition is Unchanged.  She does have many external stressors, including 3 children, one that is autistic, and caring for her aging parents. She also has been helping her sister out, keeping her 71 yr old niece, while her sister tries to get out of a bad marriage.   She has been on venlafaxine 75mg  daily and reports good compliance. She does feel that it is still working well.  ------------------------------------------------------------------------------------     Allergies  Allergen Reactions  . Tetracycline Other (See Comments)     Current Outpatient Prescriptions:  .  venlafaxine XR (EFFEXOR-XR) 75 MG 24 hr capsule, TAKE ONE CAPSULE BY MOUTH DAILY, Disp: 90 capsule, Rfl: 3 .  cetirizine (ZYRTEC) 10 MG tablet, Take 10 mg by mouth daily., Disp: , Rfl:   Review of Systems  Constitutional: Negative.   Respiratory: Positive for cough. Negative for chest tightness, shortness of breath and wheezing.   Cardiovascular: Positive for chest pain. Negative for palpitations and leg swelling.  Gastrointestinal: Negative for abdominal pain, constipation, diarrhea, nausea and vomiting.  Musculoskeletal: Positive for back pain and myalgias.  Neurological: Positive for numbness (left arm heaviness occasionally). Negative for dizziness, light-headedness and headaches.  Psychiatric/Behavioral: Positive for dysphoric mood. Negative for self-injury, sleep  disturbance and suicidal ideas. The patient is nervous/anxious.     Social History  Substance Use Topics  . Smoking status: Never Smoker  . Smokeless tobacco: Never Used  . Alcohol use No   Objective:   BP 120/80 (BP Location: Left Arm, Patient Position: Sitting, Cuff Size: Large)   Pulse 80   Temp 99 F (37.2 C) (Oral)   Resp 16   Wt 182 lb 12.8 oz (82.9 kg)   LMP 02/28/2017   SpO2 99%   BMI 28.63 kg/m  Vitals:   03/21/17 0830  BP: 120/80  Pulse: 80  Resp: 16  Temp: 99 F (37.2 C)  TempSrc: Oral  SpO2: 99%  Weight: 182 lb 12.8 oz (82.9 kg)     Physical Exam  Constitutional: She appears well-developed and well-nourished. No distress.  Neck: Normal range of motion. Neck supple. No JVD present. No tracheal deviation present. No thyromegaly present.  Cardiovascular: Normal rate, regular rhythm and normal heart sounds.  Exam reveals no gallop and no friction rub.   No murmur heard. Pulmonary/Chest: Effort normal and breath sounds normal. No respiratory distress. She has no wheezes. She has no rales.  Lymphadenopathy:    She has no cervical adenopathy.  Skin: She is not diaphoretic.  Psychiatric: Her speech is normal and behavior is normal. Judgment and thought content normal. Her mood appears anxious. Cognition and memory are normal. She exhibits a depressed mood. She expresses no suicidal ideation.  Vitals reviewed.       Assessment & Plan:     1. Chest pain, unspecified type Will refer to Dr. Ubaldo Glassing as below for consideration of stress test. Patient  could benefit from peace of mind knowing everything is ok with her heart. She does have mid-sternal chest pain and occasional left arm heaviness. They do not always occur simultaneously. She does not have chest pain or SOB with exertion. I do suspect more stress/anxiety as source at this time. Cardiology referral is appreciated.  - Ambulatory referral to Cardiology  2. Recurrent major depressive disorder, in partial  remission (HCC) Stable on venlafaxine.   3. Anxiety High score today, but patient reports it is situational. Discussed coping mechanisms, relaxation techniques, and her taking time for herself.  4. Hypercholesterolemia H/O this, not on medications.        Mar Daring, PA-C  [facility]

## 2017-11-19 ENCOUNTER — Other Ambulatory Visit: Payer: Self-pay | Admitting: Physician Assistant

## 2017-11-19 DIAGNOSIS — Z1231 Encounter for screening mammogram for malignant neoplasm of breast: Secondary | ICD-10-CM

## 2017-12-06 ENCOUNTER — Telehealth: Payer: Self-pay

## 2017-12-06 ENCOUNTER — Ambulatory Visit
Admission: RE | Admit: 2017-12-06 | Discharge: 2017-12-06 | Disposition: A | Discharge status: Home / Self Care | Payer: 59 | Source: Ambulatory Visit | Attending: Physician Assistant | Admitting: Physician Assistant

## 2017-12-06 DIAGNOSIS — Z1231 Encounter for screening mammogram for malignant neoplasm of breast: Secondary | ICD-10-CM | POA: Insufficient documentation

## 2017-12-06 NOTE — Telephone Encounter (Signed)
LMTCB

## 2017-12-06 NOTE — Telephone Encounter (Signed)
-----   Message from Mar Daring, PA-C sent at 12/06/2017  2:33 PM EDT ----- Normal mammogram. Repeat screening in one year.

## 2017-12-10 NOTE — Telephone Encounter (Signed)
Patient advised as below.  

## 2017-12-23 DIAGNOSIS — D2371 Other benign neoplasm of skin of right lower limb, including hip: Secondary | ICD-10-CM | POA: Diagnosis not present

## 2017-12-23 DIAGNOSIS — D485 Neoplasm of uncertain behavior of skin: Secondary | ICD-10-CM | POA: Diagnosis not present

## 2017-12-23 DIAGNOSIS — Z1283 Encounter for screening for malignant neoplasm of skin: Secondary | ICD-10-CM | POA: Diagnosis not present

## 2017-12-23 DIAGNOSIS — D1800 Hemangioma unspecified site: Secondary | ICD-10-CM | POA: Diagnosis not present

## 2017-12-23 DIAGNOSIS — Z808 Family history of malignant neoplasm of other organs or systems: Secondary | ICD-10-CM | POA: Diagnosis not present

## 2018-02-17 DIAGNOSIS — H2512 Age-related nuclear cataract, left eye: Secondary | ICD-10-CM | POA: Diagnosis not present

## 2018-03-07 ENCOUNTER — Other Ambulatory Visit: Payer: Self-pay | Admitting: Physician Assistant

## 2018-03-07 DIAGNOSIS — F32A Depression, unspecified: Secondary | ICD-10-CM

## 2018-03-07 DIAGNOSIS — F329 Major depressive disorder, single episode, unspecified: Secondary | ICD-10-CM

## 2018-03-10 ENCOUNTER — Other Ambulatory Visit: Payer: Self-pay

## 2018-03-10 ENCOUNTER — Encounter: Payer: Self-pay | Admitting: Emergency Medicine

## 2018-03-10 ENCOUNTER — Emergency Department: Payer: Commercial Managed Care - HMO

## 2018-03-10 ENCOUNTER — Emergency Department
Admission: EM | Admit: 2018-03-10 | Discharge: 2018-03-10 | Disposition: A | Discharge status: Home / Self Care | Payer: Commercial Managed Care - HMO | Attending: Emergency Medicine | Admitting: Emergency Medicine

## 2018-03-10 DIAGNOSIS — E7800 Pure hypercholesterolemia, unspecified: Secondary | ICD-10-CM | POA: Insufficient documentation

## 2018-03-10 DIAGNOSIS — Z79899 Other long term (current) drug therapy: Secondary | ICD-10-CM | POA: Diagnosis not present

## 2018-03-10 DIAGNOSIS — R1032 Left lower quadrant pain: Secondary | ICD-10-CM | POA: Diagnosis present

## 2018-03-10 DIAGNOSIS — R102 Pelvic and perineal pain: Secondary | ICD-10-CM | POA: Diagnosis not present

## 2018-03-10 LAB — URINALYSIS, COMPLETE (UACMP) WITH MICROSCOPIC
BACTERIA UA: NONE SEEN
Bilirubin Urine: NEGATIVE
GLUCOSE, UA: NEGATIVE mg/dL
Ketones, ur: NEGATIVE mg/dL
Leukocytes, UA: NEGATIVE
Nitrite: NEGATIVE
PROTEIN: NEGATIVE mg/dL
Specific Gravity, Urine: 1.006 (ref 1.005–1.030)
pH: 7 (ref 5.0–8.0)

## 2018-03-10 LAB — COMPREHENSIVE METABOLIC PANEL
ALBUMIN: 4.2 g/dL (ref 3.5–5.0)
ALT: 15 U/L (ref 0–44)
AST: 23 U/L (ref 15–41)
Alkaline Phosphatase: 63 U/L (ref 38–126)
Anion gap: 7 (ref 5–15)
BUN: 12 mg/dL (ref 6–20)
CHLORIDE: 110 mmol/L (ref 98–111)
CO2: 24 mmol/L (ref 22–32)
Calcium: 8.9 mg/dL (ref 8.9–10.3)
Creatinine, Ser: 0.71 mg/dL (ref 0.44–1.00)
GFR calc Af Amer: >60 mL/min (ref >60)
GFR calc non Af Amer: >60 mL/min (ref >60)
Glucose, Bld: 92 mg/dL (ref 70–99)
Potassium: 4.4 mmol/L (ref 3.5–5.1)
SODIUM: 141 mmol/L (ref 135–145)
Total Bilirubin: 0.7 mg/dL (ref 0.3–1.2)
Total Protein: 6.9 g/dL (ref 6.5–8.1)

## 2018-03-10 LAB — LIPASE, BLOOD: LIPASE: 25 U/L (ref 11–51)

## 2018-03-10 LAB — CBC
HEMATOCRIT: 43 % (ref 35.0–47.0)
Hemoglobin: 15.1 g/dL (ref 12.0–16.0)
MCH: 31.8 pg (ref 26.0–34.0)
MCHC: 35.1 g/dL (ref 32.0–36.0)
MCV: 90.6 fL (ref 80.0–100.0)
Platelets: 219 10*3/uL (ref 150–440)
RBC: 4.74 MIL/uL (ref 3.80–5.20)
RDW: 12.7 % (ref 11.5–14.5)
WBC: 11.6 10*3/uL — ABNORMAL HIGH (ref 3.6–11.0)

## 2018-03-10 LAB — POCT PREGNANCY, URINE: Preg Test, Ur: NEGATIVE

## 2018-03-10 MED ORDER — KETOROLAC TROMETHAMINE 60 MG/2ML IM SOLN: 60.0000 mg | Freq: Once | INTRAMUSCULAR | Status: DC

## 2018-03-10 NOTE — ED Triage Notes (Addendum)
Pt presents with left groin pain today. States she felt fine when she awakened but had some menstrual cramps. She took motrin and then began to have sharp groin pain. Pt denies hx of same. Pt tearful during triage.

## 2018-03-10 NOTE — ED Notes (Signed)
Pt states she is pain free at this time. 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 Pain brought her to the floor.

## 2018-03-10 NOTE — ED Provider Notes (Signed)
[facility] Emergency Department Provider Note  Time seen: 8:11 AM  I have reviewed the triage vital signs and the nursing notes.   HISTORY  Chief Complaint Groin Pain    HPI Kristi Dominguez is a 52 y.o. female with no significant past medical history presents to the emergency department for left lower quadrant/groin pain.  According to the patient this morning she awoke with lower abdominal cramping and started her period.  She took ibuprofen as she will on occasion for menstrual cramping.  However later this morning she states the pain continued to worsen and became severe in the left very low abdomen.  Denies any nausea, vomiting, diarrhea.  Denies any dysuria.  No vaginal discharge.  Sexually active with one partner only which is her husband of 17 years.  Patient denies any history of ovarian cysts or endometriosis.  States her sister and mother both had endometriosis however.   Past Medical History:  Diagnosis Date  . Depression   . Eczema     Patient Active Problem List   Diagnosis Date Noted  . Hypercholesterolemia 05/18/2015  . Dermatitis, eczematoid 04/06/2014  . Clinical depression 09/20/2011    Past Surgical History:  Procedure Laterality Date  . APPENDECTOMY  1996  . TONSILLECTOMY  1990  . VAGINAL DELIVERY  [phone]    Prior to Admission medications   Medication Sig Start Date End Date Taking? Authorizing Provider  cetirizine (ZYRTEC) 10 MG tablet Take 10 mg by mouth daily.    [provider]  venlafaxine XR (EFFEXOR-XR) 75 MG 24 hr capsule TAKE ONE CAPSULE BY MOUTH DAILY 03/07/18   Mar Daring, PA-C    Allergies  Allergen Reactions  . Tetracycline Other (See Comments)    Family History  Problem Relation Age of Onset  . Kidney failure Mother   . Hypertension Mother   . Hyperlipidemia Mother   . Heart attack Mother   . Obesity Mother   . Depression Mother   . Heart attack Father   . Hyperlipidemia  Father   . Hypertension Father   . Depression Father   . Breast cancer Paternal Aunt 28    Social History Social History   Tobacco Use  . Smoking status: Never Smoker  . Smokeless tobacco: Never Used  Substance Use Topics  . Alcohol use: No    Alcohol/week: 0.0 standard drinks  . Drug use: No    Review of Systems Constitutional: Negative for fever. Eyes: Negative for visual complaints ENT: Negative for recent illness/congestion Cardiovascular: Negative for chest pain. Respiratory: Negative for shortness of breath. Gastrointestinal: Moderate very low left lower quadrant abdominal pain.  Negative for vomiting or diarrhea Genitourinary: Negative for urinary compaints.  Positive for vaginal bleeding starting today. Musculoskeletal: Negative for musculoskeletal complaints Skin: Negative for skin complaints  Neurological: Negative for headache All other ROS negative  ____________________________________________   PHYSICAL EXAM:  VITAL SIGNS: ED Triage Vitals  Enc Vitals Group     BP 03/10/18 0754 (!) 135/110     Pulse Rate 03/10/18 0754 65     Resp 03/10/18 0754 18     Temp 03/10/18 0754 97.8 F (36.6 C)     Temp Source 03/10/18 0754 Oral     SpO2 03/10/18 0754 100 %     Weight 03/10/18 0755 185 lb (83.9 kg)     Height 03/10/18 0755 5\' 7"  (1.702 m)     Head Circumference --      Peak Flow --  Pain Score 03/10/18 0754 8     Pain Loc --      Pain Edu? --      Excl. in [place]? --    Constitutional: Alert and oriented. Well appearing and in no distress. Eyes: Normal exam ENT   Head: Normocephalic and atraumatic   Mouth/Throat: Mucous membranes are moist. Cardiovascular: Normal rate, regular rhythm. No murmur Respiratory: Normal respiratory effort without tachypnea nor retractions. Breath sounds are clear Gastrointestinal: Soft, mild tenderness to palpation in the left very low abdomen, otherwise benign abdominal exam without rebound guarding or  distention Musculoskeletal: Nontender with normal range of motion in all extremities.  Neurologic:  Normal speech and language. No gross focal neurologic deficits Skin:  Skin is warm, dry and intact.  Psychiatric: Mood and affect are normal.   ____________________________________________   RADIOLOGY  Ultrasound negative  ____________________________________________   INITIAL IMPRESSION / ASSESSMENT AND PLAN / ED COURSE  Pertinent labs & imaging results that were available during my care of the patient were reviewed by me and considered in my medical decision making (see chart for details).  Patient presents to the emergency department for left lower quadrant/left groin pain since this morning describes as moderate sharp type pain in the left lower quadrant.  Also states she is having menstrual cramping but states that feels different.  Differential would include menstrual cramping, ovarian cyst, hemorrhagic cyst, endometriosis, ectopic, colitis or diverticulitis, UTI.  We will check labs including urinalysis, obtain a pelvic ultrasound to further evaluate.   Ultrasound negative.  Patient never received the pain medication but states her pain is completely gone at this time.  Urinalysis is normal as well.  I discussed return precautions for return of the pain however as the patient is pain-free with reassuring lab work and a normal ultrasound to believe the patient is safe for discharge home at this time.  ____________________________________________   FINAL CLINICAL IMPRESSION(S) / ED DIAGNOSES  Left lower abdominal pain    Harvest Dark, MD 03/10/18 1219

## 2018-03-10 NOTE — ED Notes (Signed)
This RN attempted to give pt Toradol as ordered for pain. Merrily Brittle RN at bedside when pt refused toradol. Pt was educated that if she changed her mind to let us know.

## 2018-03-11 ENCOUNTER — Other Ambulatory Visit: Payer: Self-pay | Admitting: Physician Assistant

## 2018-03-11 DIAGNOSIS — F320 Major depressive disorder, single episode, mild: Secondary | ICD-10-CM

## 2018-03-11 MED ORDER — VENLAFAXINE HCL ER 37.5 MG PO CP24: 75.0000 mg | ORAL_CAPSULE | Freq: Every day | ORAL | 5 refills | Status: DC

## 2018-03-11 NOTE — Progress Notes (Signed)
Venlafaxine 75mg  on backorder. Changed to venlafaxine 37.5mg  take 2 capsules daily.

## 2018-05-22 ENCOUNTER — Ambulatory Visit: Payer: 59 | Admitting: Physician Assistant

## 2018-05-22 ENCOUNTER — Encounter: Payer: Self-pay | Admitting: Physician Assistant

## 2018-05-22 VITALS — BP 126/90 | HR 72 | Temp 98.3°F | Resp 16 | Wt 188.4 lb

## 2018-05-22 DIAGNOSIS — J029 Acute pharyngitis, unspecified: Secondary | ICD-10-CM

## 2018-05-22 DIAGNOSIS — J069 Acute upper respiratory infection, unspecified: Secondary | ICD-10-CM | POA: Diagnosis not present

## 2018-05-22 LAB — POCT RAPID STREP A (OFFICE): Rapid Strep A Screen: NEGATIVE

## 2018-05-22 NOTE — Progress Notes (Signed)
       Patient: Kristi Dominguez Female    DOB: 03-16-1966   52 y.o.   MRN: [id number] Visit Date: 05/22/2018  Today's Provider: Trinna Post, PA-C   Chief Complaint  Patient presents with  . Sore Throat   Subjective:    HPI Sore Throat: Patient complains of sore throat. Associated symptoms include dry cough, nasal blockage, post nasal drip, sinus and nasal congestion and sore throat.Onset of symptoms was 2 days ago, gradually worsening since that time. She is drinking plenty of fluids. She does not have had recent close exposure to someone with proven streptococcal pharyngitis.     Allergies  Allergen Reactions  . Tetracycline Other (See Comments)     Current Outpatient Medications:  .  venlafaxine XR (EFFEXOR XR) 37.5 MG 24 hr capsule, Take 2 capsules (75 mg total) by mouth daily with breakfast., Disp: 60 capsule, Rfl: 5 .  cetirizine (ZYRTEC) 10 MG tablet, Take 10 mg by mouth daily., Disp: , Rfl:   Review of Systems  Constitutional: Positive for fatigue.  HENT: Positive for congestion, postnasal drip and sore throat.   Respiratory: Positive for cough.     Social History   Tobacco Use  . Smoking status: Never Smoker  . Smokeless tobacco: Never Used  Substance Use Topics  . Alcohol use: No    Alcohol/week: 0.0 standard drinks   Objective:   BP 126/90 (BP Location: Right Arm, Patient Position: Sitting, Cuff Size: Normal)   Pulse 72   Temp 98.3 F (36.8 C) (Oral)   Resp 16   Wt 188 lb 6.4 oz (85.5 kg)   SpO2 98%   BMI 29.51 kg/m  Vitals:   05/22/18 0906  BP: 126/90  Pulse: 72  Resp: 16  Temp: 98.3 F (36.8 C)  TempSrc: Oral  SpO2: 98%  Weight: 188 lb 6.4 oz (85.5 kg)     Physical Exam  Constitutional: She appears well-developed and well-nourished. She does not appear ill.  HENT:  Right Ear: Tympanic membrane and ear canal normal.  Left Ear: Tympanic membrane and ear canal normal.  Mouth/Throat: Uvula is midline and mucous membranes are  normal. Posterior oropharyngeal erythema present. No oropharyngeal exudate or posterior oropharyngeal edema.  Pulmonary/Chest: Effort normal and breath sounds normal.  Lymphadenopathy:    She has no cervical adenopathy.  Skin: Skin is warm and dry.        Assessment & Plan:     1. Viral URI  Rapid strep negative. Counseled regarding signs and symptoms of viral and bacterial respiratory infections. Advised to call or return for additional evaluation if she develops any sign of bacterial infection, or if current symptoms last longer than 10 days.    2. Sore throat  - POCT rapid strep A  Return if symptoms worsen or fail to improve.  The entirety of the information documented in the History of Present Illness, Review of Systems and Physical Exam were personally obtained by me. Portions of this information were initially documented by Lynford Humphrey, CMA and reviewed by me for thoroughness and accuracy.         Trinna Post, PA-C  [facility]

## 2018-05-22 NOTE — Patient Instructions (Signed)

## 2018-06-05 ENCOUNTER — Other Ambulatory Visit: Payer: Self-pay | Admitting: Physician Assistant

## 2018-06-05 DIAGNOSIS — F320 Major depressive disorder, single episode, mild: Secondary | ICD-10-CM

## 2018-06-05 NOTE — Telephone Encounter (Signed)
Pt has a question to ask about venlafaxine XR (EFFEXOR XR) 37.5 MG 24 hr capsule.  Please call pt back to discuss.  Thanks, [employer]

## 2018-06-06 MED ORDER — VENLAFAXINE HCL ER 150 MG PO CP24: 150.0000 mg | ORAL_CAPSULE | Freq: Every day | ORAL | 1 refills | Status: DC

## 2018-06-06 NOTE — Telephone Encounter (Signed)
Stop 75mg  (was taking 2 37.5mg  XR since the 75mg  XR was on back order).   Start venlafaxine XR 150mg . [facility].

## 2018-06-06 NOTE — Telephone Encounter (Signed)
Patient asking if her effexor can be increase please.  She is now using [facility].

## 2018-06-09 NOTE — Telephone Encounter (Signed)
Patient advised as directed below. 

## 2018-07-09 ENCOUNTER — Encounter: Payer: Self-pay | Admitting: Physician Assistant

## 2018-07-09 ENCOUNTER — Other Ambulatory Visit: Payer: Self-pay

## 2018-07-09 ENCOUNTER — Ambulatory Visit (INDEPENDENT_AMBULATORY_CARE_PROVIDER_SITE_OTHER): Payer: 59 | Admitting: Physician Assistant

## 2018-07-09 VITALS — BP 150/102 | HR 90 | Temp 98.3°F | Ht 67.0 in | Wt 189.6 lb

## 2018-07-09 DIAGNOSIS — Z23 Encounter for immunization: Secondary | ICD-10-CM

## 2018-07-09 DIAGNOSIS — Z0000 Encounter for general adult medical examination without abnormal findings: Secondary | ICD-10-CM

## 2018-07-09 DIAGNOSIS — E559 Vitamin D deficiency, unspecified: Secondary | ICD-10-CM

## 2018-07-09 DIAGNOSIS — G5602 Carpal tunnel syndrome, left upper limb: Secondary | ICD-10-CM

## 2018-07-09 DIAGNOSIS — Z1211 Encounter for screening for malignant neoplasm of colon: Secondary | ICD-10-CM | POA: Diagnosis not present

## 2018-07-09 DIAGNOSIS — E7800 Pure hypercholesterolemia, unspecified: Secondary | ICD-10-CM

## 2018-07-09 DIAGNOSIS — F3341 Major depressive disorder, recurrent, in partial remission: Secondary | ICD-10-CM | POA: Diagnosis not present

## 2018-07-09 DIAGNOSIS — M25561 Pain in right knee: Secondary | ICD-10-CM

## 2018-07-09 DIAGNOSIS — Z6829 Body mass index (BMI) 29.0-29.9, adult: Secondary | ICD-10-CM

## 2018-07-09 DIAGNOSIS — G8929 Other chronic pain: Secondary | ICD-10-CM

## 2018-07-09 DIAGNOSIS — Z114 Encounter for screening for human immunodeficiency virus [HIV]: Secondary | ICD-10-CM

## 2018-07-09 NOTE — Patient Instructions (Signed)

## 2018-07-09 NOTE — Progress Notes (Signed)
Patient: Kristi Dominguez, Female    DOB: Aug 14, 1965, 52 y.o.   MRN: [id number] Visit Date: 07/09/2018  Today's Provider: Mar Daring, PA-C   Chief Complaint  Patient presents with  . Annual Exam  . Knee Pain  . right breast pain  . nerve pain in left arm   Subjective:    Annual physical exam Kristi Dominguez is a 51 y.o. female who presents today for health maintenance and complete physical. She feels well. She reports exercising none. She reports she is sleeping poorly pt reports she wakes up a lot during the night.  05/22/16 CPE 05/22/16 Pap/HPV-neg, recheck in 3-5 yrs 12/06/17 Mammogram-BI-RADS 1   Pt reports some pain in right knee at times but not constant.  Feels it is mostly associated with certain shoes and when she is busy on her feet all day, the next morning she will be stiff.   Pt reports she has been having nerve pain in left arm that goes down to her hand at left side of ring finger for more than 6 months. Reports it feels similar to when she had carpal tunnel while she was pregnant. She does not want treatment for her knee or arm at this time, just would like them to be documented.  -----------------------------------------------------------------   Review of Systems  Constitutional: Positive for activity change, diaphoresis and fatigue.  HENT: Negative.   Eyes: Negative.   Respiratory: Positive for cough.   Cardiovascular: Negative.   Gastrointestinal: Negative.   Endocrine: Negative.   Genitourinary: Negative.   Musculoskeletal: Positive for arthralgias (right knee pain at times).  Skin: Negative.   Allergic/Immunologic: Negative.   Neurological: Positive for numbness (right arm).  Hematological: Negative.   Psychiatric/Behavioral: Negative.     Social History      She  reports that she has never smoked. She has never used smokeless tobacco. She reports that she does not drink alcohol or use drugs.       Social History    Socioeconomic History  . Marital status: Married    Spouse name: Not on file  . Number of children: Not on file  . Years of education: Not on file  . Highest education level: Not on file  Occupational History  . Not on file  Social Needs  . Financial resource strain: Not on file  . Food insecurity:    Worry: Not on file    Inability: Not on file  . Transportation needs:    Medical: Not on file    Non-medical: Not on file  Tobacco Use  . Smoking status: Never Smoker  . Smokeless tobacco: Never Used  Substance and Sexual Activity  . Alcohol use: No    Alcohol/week: 0.0 standard drinks  . Drug use: No  . Sexual activity: Yes    Birth control/protection: None  Lifestyle  . Physical activity:    Days per week: Not on file    Minutes per session: Not on file  . Stress: Not on file  Relationships  . Social connections:    Talks on phone: Not on file    Gets together: Not on file    Attends religious service: Not on file    Active member of club or organization: Not on file    Attends meetings of clubs or organizations: Not on file    Relationship status: Not on file  Other Topics Concern  . Not on file  Social History Narrative  .  Not on file    Past Medical History:  Diagnosis Date  . Depression   . Eczema      Patient Active Problem List   Diagnosis Date Noted  . Hypercholesterolemia 05/18/2015  . Dermatitis, eczematoid 04/06/2014  . Clinical depression 09/20/2011    Past Surgical History:  Procedure Laterality Date  . APPENDECTOMY  1996  . TONSILLECTOMY  1990  . VAGINAL DELIVERY  2004,2006,2008    Family History        Family Status  Relation Name Status  . Mother  Alive  . Father  Alive  . [name]  (Not Specified)        Her family history includes Breast cancer (age of onset: 43) in her paternal aunt; Depression in her father and mother; Heart attack in her father and mother; Hyperlipidemia in her father and mother; Hypertension in her  father and mother; Kidney failure in her mother; Obesity in her mother.      Allergies  Allergen Reactions  . Tetracycline Other (See Comments)     Current Outpatient Medications:  .  cetirizine (ZYRTEC) 10 MG tablet, Take 10 mg by mouth daily., Disp: , Rfl:  .  venlafaxine XR (EFFEXOR-XR) 150 MG 24 hr capsule, Take 1 capsule (150 mg total) by mouth daily with breakfast., Disp: 90 capsule, Rfl: 1   Patient Care Team: Mar Daring, PA-C as PCP - General (Family Medicine)      Objective:   Vitals: BP (!) 150/102 (BP Location: Left Arm, Patient Position: Sitting, Cuff Size: Normal)   Pulse 90   Temp 98.3 F (36.8 C) (Oral)   Ht 5\' 7"  (1.702 m)   Wt 189 lb 9.6 oz (86 kg)   SpO2 97%   BMI 29.70 kg/m    Vitals:   07/09/18 1015  BP: (!) 150/102  Pulse: 90  Temp: 98.3 F (36.8 C)  TempSrc: Oral  SpO2: 97%  Weight: 189 lb 9.6 oz (86 kg)  Height: 5\' 7"  (1.702 m)     Physical Exam Vitals signs reviewed.  Constitutional:      General: She is not in acute distress.    Appearance: She is well-developed. She is not diaphoretic.     Comments: Overweight  HENT:     Head: Normocephalic and atraumatic.     Right Ear: Hearing, tympanic membrane, ear canal and external ear normal.     Left Ear: Hearing, tympanic membrane, ear canal and external ear normal.     Nose: Nose normal.     Mouth/Throat:     Lips: Pink.     Mouth: Mucous membranes are moist. No oral lesions.     Dentition: Normal dentition.     Tongue: No lesions.     Pharynx: Oropharynx is clear. Uvula midline. No oropharyngeal exudate.  Eyes:     General: No scleral icterus.       Right eye: No discharge.        Left eye: No discharge.     Conjunctiva/sclera: Conjunctivae normal.     Pupils: Pupils are equal, round, and reactive to light.  Neck:     Musculoskeletal: Normal range of motion and neck supple.     Thyroid: No thyromegaly.     Vascular: No carotid bruit or JVD.     Trachea: No tracheal  deviation.  Cardiovascular:     Rate and Rhythm: Normal rate and regular rhythm.     Heart sounds: Normal heart sounds. No murmur. No friction  rub. No gallop.   Pulmonary:     Effort: Pulmonary effort is normal. No respiratory distress.     Breath sounds: Normal breath sounds. No wheezing or rales.  Chest:     Chest wall: No tenderness.     Breasts:        Right: Normal. No mass, skin change or tenderness.        Left: Normal. No mass or tenderness.  Abdominal:     General: Bowel sounds are normal. There is no distension.     Palpations: Abdomen is soft. There is no mass.     Tenderness: There is no abdominal tenderness. There is no guarding or rebound.  Musculoskeletal: Normal range of motion.        General: No tenderness.  Lymphadenopathy:     Cervical: No cervical adenopathy.     Upper Body:     Right upper body: No supraclavicular, axillary or pectoral adenopathy.     Left upper body: No supraclavicular, axillary or pectoral adenopathy.  Skin:    General: Skin is warm and dry.     Findings: No rash.  Neurological:     Mental Status: She is alert and oriented to person, place, and time.  Psychiatric:        Behavior: Behavior normal.        Thought Content: Thought content normal.        Judgment: Judgment normal.      Depression Screen PHQ 2/9 Scores 07/09/2018 03/21/2017  PHQ - 2 Score 1 0  PHQ- 9 Score 6 6      Assessment & Plan:     Routine Health Maintenance and Physical Exam  Exercise Activities and Dietary recommendations Goals   None     Immunization History  Administered Date(s) Administered  . Influenza,inj,Quad PF,6+ Mos 05/18/2015, 05/22/2016  . Tdap 05/22/2016    Health Maintenance  Topic Date Due  . HIV Screening  05/13/1982  . COLONOSCOPY  05/13/2017  . INFLUENZA VACCINE  02/20/2018  . PAP SMEAR-Modifier  05/23/2019  . MAMMOGRAM  12/07/2019  . TETANUS/TDAP  05/22/2026     Discussed health benefits of physical activity, and  encouraged her to engage in regular exercise appropriate for her age and condition.    1. Annual physical exam Normal physical exam today. Will check labs as below and f/u pending lab results. If labs are stable and WNL she will not need to have these rechecked for one year at her next annual physical exam. She is to call the office in the meantime if she has any acute issue, questions or concerns. - TSH - HgB A1c - Vitamin D (25 hydroxy)  2. Hypercholesterolemia Diet controlled. Will check labs as below and f/u pending results. - CBC with Differential/Platelet - Comprehensive metabolic panel - Lipid Panel With LDL/HDL Ratio - HgB A1c  3. Recurrent major depressive disorder, in partial remission (HCC) Stable. Continue venlafaxine 150mg  XR. Will check labs as below and f/u pending results. - TSH  4. Colon cancer screening Due for colonoscopy. Referral placed.  - Ambulatory referral to Gastroenterology  5. BMI 29.0-29.9,adult Counseled patient on healthy lifestyle modifications including dieting and exercise.  - HgB A1c  6. Carpal tunnel syndrome, left Patient does not want treatment at this time. Will call if worsens. Just wanted it documented.   7. Chronic pain of right knee No treatment, just wanted documented she is having intermittent issues.   8. Screening for HIV without presence of risk factors  Will check labs as below and f/u pending results. - HIV antibody (with reflex)  9. Need for influenza vaccination Flu vaccine given today without complication. Patient sat upright for 15 minutes to check for adverse reaction before being released. - Flu Vaccine QUAD 36+ mos IM  --------------------------------------------------------------------    Mar Daring, PA-C  [facility]

## 2018-07-10 LAB — CBC WITH DIFFERENTIAL/PLATELET
Basophils Absolute: 0 10*3/uL (ref 0.0–0.2)
Basos: 1 %
EOS (ABSOLUTE): 0.2 10*3/uL (ref 0.0–0.4)
Eos: 4 %
Hematocrit: 44.1 % (ref 34.0–46.6)
Hemoglobin: 14.8 g/dL (ref 11.1–15.9)
Immature Grans (Abs): 0 10*3/uL (ref 0.0–0.1)
Immature Granulocytes: 0 %
Lymphocytes Absolute: 1.6 10*3/uL (ref 0.7–3.1)
Lymphs: 31 %
MCH: 31.4 pg (ref 26.6–33.0)
MCHC: 33.6 g/dL (ref 31.5–35.7)
MCV: 93 fL (ref 79–97)
Monocytes Absolute: 0.4 10*3/uL (ref 0.1–0.9)
Monocytes: 8 %
Neutrophils Absolute: 2.9 10*3/uL (ref 1.4–7.0)
Neutrophils: 56 %
Platelets: 240 10*3/uL (ref 150–450)
RBC: 4.72 x10E6/uL (ref 3.77–5.28)
RDW: 12.9 % (ref 12.3–15.4)
WBC: 5.2 10*3/uL (ref 3.4–10.8)

## 2018-07-10 LAB — COMPREHENSIVE METABOLIC PANEL WITH GFR
ALT: 15 [IU]/L (ref 0–32)
AST: 13 [IU]/L (ref 0–40)
Albumin/Globulin Ratio: 2 (ref 1.2–2.2)
Albumin: 4.5 g/dL (ref 3.5–5.5)
Alkaline Phosphatase: 71 [IU]/L (ref 39–117)
BUN/Creatinine Ratio: 18 (ref 9–23)
BUN: 14 mg/dL (ref 6–24)
Bilirubin Total: 0.2 mg/dL (ref 0.0–1.2)
CO2: 19 mmol/L — ABNORMAL LOW (ref 20–29)
Calcium: 9.3 mg/dL (ref 8.7–10.2)
Chloride: 102 mmol/L (ref 96–106)
Creatinine, Ser: 0.78 mg/dL (ref 0.57–1.00)
GFR calc Af Amer: 102 mL/min/{1.73_m2}
GFR calc non Af Amer: 88 mL/min/{1.73_m2}
Globulin, Total: 2.3 g/dL (ref 1.5–4.5)
Glucose: 68 mg/dL (ref 65–99)
Potassium: 4.4 mmol/L (ref 3.5–5.2)
Sodium: 138 mmol/L (ref 134–144)
Total Protein: 6.8 g/dL (ref 6.0–8.5)

## 2018-07-10 LAB — LIPID PANEL WITH LDL/HDL RATIO
Cholesterol, Total: 282 mg/dL — ABNORMAL HIGH (ref 100–199)
HDL: 64 mg/dL
LDL Calculated: 184 mg/dL — ABNORMAL HIGH (ref 0–99)
LDl/HDL Ratio: 2.9 ratio (ref 0.0–3.2)
Triglycerides: 168 mg/dL — ABNORMAL HIGH (ref 0–149)
VLDL Cholesterol Cal: 34 mg/dL (ref 5–40)

## 2018-07-10 LAB — VITAMIN D 25 HYDROXY (VIT D DEFICIENCY, FRACTURES): Vit D, 25-Hydroxy: 19.9 ng/mL — ABNORMAL LOW (ref 30.0–100.0)

## 2018-07-10 LAB — HEMOGLOBIN A1C
Est. average glucose Bld gHb Est-mCnc: 108 mg/dL
HEMOGLOBIN A1C: 5.4 % (ref 4.8–5.6)

## 2018-07-10 LAB — HIV ANTIBODY (ROUTINE TESTING W REFLEX): HIV SCREEN 4TH GENERATION: NONREACTIVE

## 2018-07-10 LAB — TSH: TSH: 2.68 u[IU]/mL (ref 0.450–4.500)

## 2018-07-11 ENCOUNTER — Telehealth: Payer: Self-pay

## 2018-07-11 MED ORDER — VITAMIN D (ERGOCALCIFEROL) 1.25 MG (50000 UNIT) PO CAPS: 50000.0000 [IU] | ORAL_CAPSULE | ORAL | 0 refills | Status: DC

## 2018-07-11 NOTE — Telephone Encounter (Signed)
[email or web address] . KW 

## 2018-07-11 NOTE — Addendum Note (Signed)
Addended by: Mar Daring on: 07/11/2018 01:55 PM   Modules accepted: Orders

## 2018-07-11 NOTE — Telephone Encounter (Signed)
-----   Message from Mar Daring, [place] sent at 07/11/2018  1:54 PM EST ----- Blood count is normal. Kidney and liver function are normal. Sugar is normal. Cholesterol is still elevated but numbers are slightly improved from last year. Currently your risk of having a cardiovascular event over the next 10 years is low at 2.4%. No need to start cholesterol medication. Work on lifestyle modifications with heart healthy dieting and exercise. Thyroid is normal. Vit D is low. Would benefit from high dose vit d supplement once weekly x 3 months then transition to OTC Vit D supplement of 1000-2000 IU daily.

## 2018-07-14 NOTE — Telephone Encounter (Signed)
Patient advised as directed below. 

## 2018-07-31 ENCOUNTER — Other Ambulatory Visit: Payer: Self-pay

## 2018-07-31 DIAGNOSIS — Z1211 Encounter for screening for malignant neoplasm of colon: Secondary | ICD-10-CM

## 2018-08-11 ENCOUNTER — Telehealth: Payer: Self-pay | Admitting: Gastroenterology

## 2018-08-11 NOTE — Telephone Encounter (Signed)
Patients call has been returned.  Advised her that as long as she is wearing a Tampon she is okay to have her colonoscopy.  Thanks [employer]

## 2018-08-11 NOTE — Telephone Encounter (Signed)
Patient called stating she has a colonoscopy scheduled for 08-13-2018 with Dr Vicente Males but has started her period unexpectedly. Does she need to reschedule?

## 2018-08-13 ENCOUNTER — Ambulatory Visit: Payer: 59 | Admitting: Anesthesiology

## 2018-08-13 ENCOUNTER — Other Ambulatory Visit: Payer: Self-pay

## 2018-08-13 ENCOUNTER — Encounter
Admission: RE | Disposition: A | Discharge status: Home / Self Care | Payer: Self-pay | Source: Home / Self Care | Attending: Gastroenterology

## 2018-08-13 ENCOUNTER — Ambulatory Visit
Admission: RE | Admit: 2018-08-13 | Discharge: 2018-08-13 | Disposition: A | Discharge status: Home / Self Care | Payer: 59 | Attending: Gastroenterology | Admitting: Gastroenterology

## 2018-08-13 DIAGNOSIS — Z79899 Other long term (current) drug therapy: Secondary | ICD-10-CM | POA: Diagnosis not present

## 2018-08-13 DIAGNOSIS — Z1211 Encounter for screening for malignant neoplasm of colon: Secondary | ICD-10-CM | POA: Diagnosis not present

## 2018-08-13 DIAGNOSIS — F329 Major depressive disorder, single episode, unspecified: Secondary | ICD-10-CM | POA: Insufficient documentation

## 2018-08-13 HISTORY — PX: COLONOSCOPY WITH PROPOFOL: SHX5780

## 2018-08-13 LAB — POCT PREGNANCY, URINE: Preg Test, Ur: NEGATIVE

## 2018-08-13 SURGERY — COLONOSCOPY WITH PROPOFOL
Anesthesia: General

## 2018-08-13 MED ORDER — PROPOFOL 10 MG/ML IV BOLUS
INTRAVENOUS | Status: DC | PRN
  Administered 2018-08-13: 50 mg via INTRAVENOUS

## 2018-08-13 MED ORDER — PROPOFOL 500 MG/50ML IV EMUL
INTRAVENOUS | Status: DC | PRN
  Administered 2018-08-13: 145 ug/kg/min via INTRAVENOUS

## 2018-08-13 MED ORDER — SODIUM CHLORIDE 0.9 % IV SOLN
INTRAVENOUS | Status: DC
  Administered 2018-08-13: 13:00:00 via INTRAVENOUS

## 2018-08-13 NOTE — H&P (Signed)
Jonathon Bellows, MD [address] [place], [place], [place], [id number], [place], [place], [place], [address] Phone: [phone]  Fax: [phone]  Primary Care Physician:  Mar Daring, PA-C   Pre-Procedure History & Physical: HPI:  Kristi Dominguez is a 53 y.o. female is here for an colonoscopy.   Past Medical History:  Diagnosis Date  . Depression   . Eczema     Past Surgical History:  Procedure Laterality Date  . APPENDECTOMY  1996  . TONSILLECTOMY  1990  . VAGINAL DELIVERY  [phone]    Prior to Admission medications   Medication Sig Start Date End Date Taking? Authorizing Provider  venlafaxine XR (EFFEXOR-XR) 150 MG 24 hr capsule Take 1 capsule (150 mg total) by mouth daily with breakfast. 06/06/18  Yes Burnette, Clearnce Sorrel, PA-C  Vitamin D, Ergocalciferol, (DRISDOL) 1.25 MG (50000 UT) CAPS capsule Take 1 capsule (50,000 Units total) by mouth every 7 (seven) days. 07/11/18  Yes Mar Daring, PA-C  cetirizine (ZYRTEC) 10 MG tablet Take 10 mg by mouth daily.    [provider]    Allergies as of 07/31/2018 - Review Complete 07/09/2018  Allergen Reaction Noted  . Tetracycline Other (See Comments) 05/18/2015    Family History  Problem Relation Age of Onset  . Kidney failure Mother   . Hypertension Mother   . Hyperlipidemia Mother   . Heart attack Mother   . Obesity Mother   . Depression Mother   . Heart attack Father   . Hyperlipidemia Father   . Hypertension Father   . Depression Father   . Breast cancer Paternal Aunt 58    Social History   Socioeconomic History  . Marital status: Married    Spouse name: Not on file  . Number of children: Not on file  . Years of education: Not on file  . Highest education level: Not on file  Occupational History  . Not on file  Social Needs  . Financial resource strain: Not on file  . Food insecurity:    Worry: Not on file    Inability: Not on file  .  Transportation needs:    Medical: Not on file    Non-medical: Not on file  Tobacco Use  . Smoking status: Never Smoker  . Smokeless tobacco: Never Used  Substance and Sexual Activity  . Alcohol use: No    Alcohol/week: 0.0 standard drinks  . Drug use: No  . Sexual activity: Yes    Birth control/protection: None  Lifestyle  . Physical activity:    Days per week: Not on file    Minutes per session: Not on file  . Stress: Not on file  Relationships  . Social connections:    Talks on phone: Not on file    Gets together: Not on file    Attends religious service: Not on file    Active member of club or organization: Not on file    Attends meetings of clubs or organizations: Not on file    Relationship status: Not on file  . Intimate partner violence:    Fear of current or ex partner: Not on file    Emotionally abused: Not on file    Physically abused: Not on file    Forced sexual activity: Not on file  Other Topics Concern  . Not on file  Social History Narrative  . Not on file    Review of Systems: See HPI, otherwise negative  ROS  Physical Exam: BP (!) 160/99   Pulse 72   Temp (!) 97.1 F (36.2 C) (Tympanic)   Resp 20   Ht 5\' 7"  (1.702 m)   Wt 83.9 kg   SpO2 100%   BMI 28.98 kg/m  General:   Alert,  pleasant and cooperative in NAD Head:  Normocephalic and atraumatic. Neck:  Supple; no masses or thyromegaly. Lungs:  Clear throughout to auscultation, normal respiratory effort.    Heart:  +S1, +S2, Regular rate and rhythm, No edema. Abdomen:  Soft, nontender and nondistended. Normal bowel sounds, without guarding, and without rebound.   Neurologic:  Alert and  oriented x4;  grossly normal neurologically.  Impression/Plan: Kristi Dominguez is here for an colonoscopy to be performed for Screening colonoscopy average risk   Risks, benefits, limitations, and alternatives regarding  colonoscopy have been reviewed with the patient.  Questions have been answered.  All  parties agreeable.   Jonathon Bellows, MD  08/13/2018, 1:35 PM

## 2018-08-13 NOTE — Transfer of Care (Signed)
Immediate Anesthesia Transfer of Care Note  Patient: Kristi Dominguez  Procedure(s) Performed: COLONOSCOPY WITH PROPOFOL (N/A )  Patient Location: PACU  Anesthesia Type:General  Level of Consciousness: drowsy  Airway & Oxygen Therapy: Patient Spontanous Breathing and Patient connected to nasal cannula oxygen  Post-op Assessment: Report given to RN and Post -op Vital signs reviewed and stable  Post vital signs: Reviewed and stable  Last Vitals:  Vitals Value Taken Time  BP 103/60 08/13/2018  2:22 PM  Temp 36.1 C 08/13/2018  2:21 PM  Pulse 75 08/13/2018  2:23 PM  Resp 17 08/13/2018  2:23 PM  SpO2 100 % 08/13/2018  2:23 PM  Vitals shown include unvalidated device data.  Last Pain:  Vitals:   08/13/18 1421  TempSrc: Tympanic  PainSc:          Complications: No apparent anesthesia complications

## 2018-08-13 NOTE — Anesthesia Preprocedure Evaluation (Addendum)
Anesthesia Evaluation  Patient identified by MRN, date of birth, ID band Patient awake    Reviewed: Allergy & Precautions, H&P , NPO status , reviewed documented beta blocker date and time   Airway Mallampati: II  TM Distance: >3 FB Neck ROM: full    Dental  (+) Teeth Intact   Pulmonary    Pulmonary exam normal        Cardiovascular Normal cardiovascular exam     Neuro/Psych PSYCHIATRIC DISORDERS Depression    GI/Hepatic neg GERD  ,  Endo/Other    Renal/GU      Musculoskeletal  (+) Arthritis , Knee pain   Abdominal   Peds  Hematology   Anesthesia Other Findings Past Medical History: No date: Depression No date: Eczema  Past Surgical History: 1996: APPENDECTOMY 1990: TONSILLECTOMY 2004,2006,2008: VAGINAL DELIVERY     Reproductive/Obstetrics                            Anesthesia Physical Anesthesia Plan  ASA: II  Anesthesia Plan: General   Post-op Pain Management:    Induction: Intravenous  PONV Risk Score and Plan: 3 and Treatment may vary due to age or medical condition and TIVA  Airway Management Planned: Nasal Cannula and Natural Airway  Additional Equipment:   Intra-op Plan:   Post-operative Plan:   Informed Consent: I have reviewed the patients History and Physical, chart, labs and discussed the procedure including the risks, benefits and alternatives for the proposed anesthesia with the patient or authorized representative who has indicated his/her understanding and acceptance.     Dental Advisory Given  Plan Discussed with: CRNA  Anesthesia Plan Comments:        Anesthesia Quick Evaluation

## 2018-08-13 NOTE — Anesthesia Post-op Follow-up Note (Signed)
Anesthesia QCDR form completed.        

## 2018-08-13 NOTE — Op Note (Signed)
[facility] Patient Name: Kristi Dominguez Procedure Date: 08/13/2018 1:35 PM MRN: [id number] Account #: [id number] Date of Birth: 1966-07-02 Admit Type: Outpatient Age: 53 Room: [facility] ENDO ROOM 2 Gender: Female Note Status: Finalized Procedure:            Colonoscopy Indications:          Screening for colorectal malignant neoplasm Providers:            Jonathon Bellows MD, MD Medicines:            Monitored Anesthesia Care Complications:        No immediate complications. Procedure:            Pre-Anesthesia Assessment:                       - Prior to the procedure, a History and Physical was                        performed, and patient medications, allergies and                        sensitivities were reviewed. The patient's tolerance of                        previous anesthesia was reviewed.                       - The risks and benefits of the procedure and the                        sedation options and risks were discussed with the                        patient. All questions were answered and informed                        consent was obtained.                       - ASA Grade Assessment: II - A patient with mild                        systemic disease.                       After obtaining informed consent, the colonoscope was                        passed under direct vision. Throughout the procedure,                        the patient's blood pressure, pulse, and oxygen                        saturations were monitored continuously. The                        Colonoscope was introduced through the anus and                        advanced to the the cecum, identified by the  appendiceal orifice, IC valve and transillumination.                        The colonoscopy was performed with ease. The patient                        tolerated the procedure well. The quality of the bowel                        preparation  was good. Findings:      The perianal and digital rectal examinations were normal.      The entire examined colon appeared normal on direct and retroflexion       views. Impression:           - The entire examined colon is normal on direct and                        retroflexion views.                       - No specimens collected. Recommendation:       - Discharge patient to home (with escort).                       - Resume previous diet.                       - Continue present medications.                       - Repeat colonoscopy in 10 years for screening purposes. Procedure Code(s):    --- Professional ---                       [phone], Colonoscopy, flexible; diagnostic, including                        collection of specimen(s) by brushing or washing, when                        performed (separate procedure) Diagnosis Code(s):    --- Professional ---                       Z12.11, Encounter for screening for malignant neoplasm                        of colon CPT copyright 2018 American Medical Association. All rights reserved. The codes documented in this report are preliminary and upon coder review may  be revised to meet current compliance requirements. Jonathon Bellows, MD Jonathon Bellows MD, MD 08/13/2018 2:18:39 PM This report has been signed electronically. Number of Addenda: 0 Note Initiated On: 08/13/2018 1:35 PM Scope Withdrawal Time: 0 hours 16 minutes 33 seconds  Total Procedure Duration: 0 hours 25 minutes 8 seconds       [facility]

## 2018-08-14 ENCOUNTER — Telehealth: Payer: Self-pay | Admitting: Gastroenterology

## 2018-08-14 ENCOUNTER — Telehealth: Payer: Self-pay | Admitting: Physician Assistant

## 2018-08-14 ENCOUNTER — Encounter: Payer: Self-pay | Admitting: Gastroenterology

## 2018-08-14 NOTE — Telephone Encounter (Signed)
Patient is call today stating she had a colonoscopy by Dr Vicente Males 08-13-18 & the first IV site is red,swollen and hot. Please advise patient. She spoke with the nurse in Endo and they advised her to call our office.

## 2018-08-14 NOTE — Telephone Encounter (Signed)
Colonoscopy done on yesterday. IV was put in arm and was taken out because it was painful.  However, that hand became swollen, red, and warm to the touch. Pt called the office and they told her she needed to contact her PCP.  Please advise.  Thanks, [employer]

## 2018-08-15 NOTE — Telephone Encounter (Signed)
Please see if she can come in for eval. If not, she can try heat to the area and ibuprofen. Suspect a superficial thrombophlebitis (small vein blood clot, non threatening) vs a cellulitis (skin infection).   Hard to say without seeing it.

## 2018-08-15 NOTE — Anesthesia Postprocedure Evaluation (Signed)
Anesthesia Post Note  Patient: Kristi Dominguez  Procedure(s) Performed: COLONOSCOPY WITH PROPOFOL (N/A )  Patient location during evaluation: Endoscopy Anesthesia Type: General Level of consciousness: awake and alert Pain management: pain level controlled Vital Signs Assessment: post-procedure vital signs reviewed and stable Respiratory status: spontaneous breathing, nonlabored ventilation and respiratory function stable Cardiovascular status: blood pressure returned to baseline and stable Postop Assessment: no apparent nausea or vomiting Anesthetic complications: no     Last Vitals:  Vitals:   08/13/18 1431 08/13/18 1441  BP: 110/72 114/68  Pulse: 80 78  Resp: 18 16  Temp:    SpO2: 99%     Last Pain:  Vitals:   08/14/18 0729  TempSrc:   PainSc: 0-No pain                 Alphonsus Sias

## 2018-08-15 NOTE — Telephone Encounter (Signed)
Patient was advised. Expressed understanding.  

## 2018-10-16 ENCOUNTER — Other Ambulatory Visit: Payer: Self-pay | Admitting: Physician Assistant

## 2018-10-16 DIAGNOSIS — E559 Vitamin D deficiency, unspecified: Secondary | ICD-10-CM

## 2018-11-25 IMAGING — CR DG CHEST 2V
1 series · 2 of 2 positions shown · non-contrast
Comparison: None.

CLINICAL DATA: Mid chest pain

EXAM:
CHEST  2 VIEW

[Series 1: dg chest 2 view · 0.14mm/px · 2 of 2 slices shown]
[im 1/2]
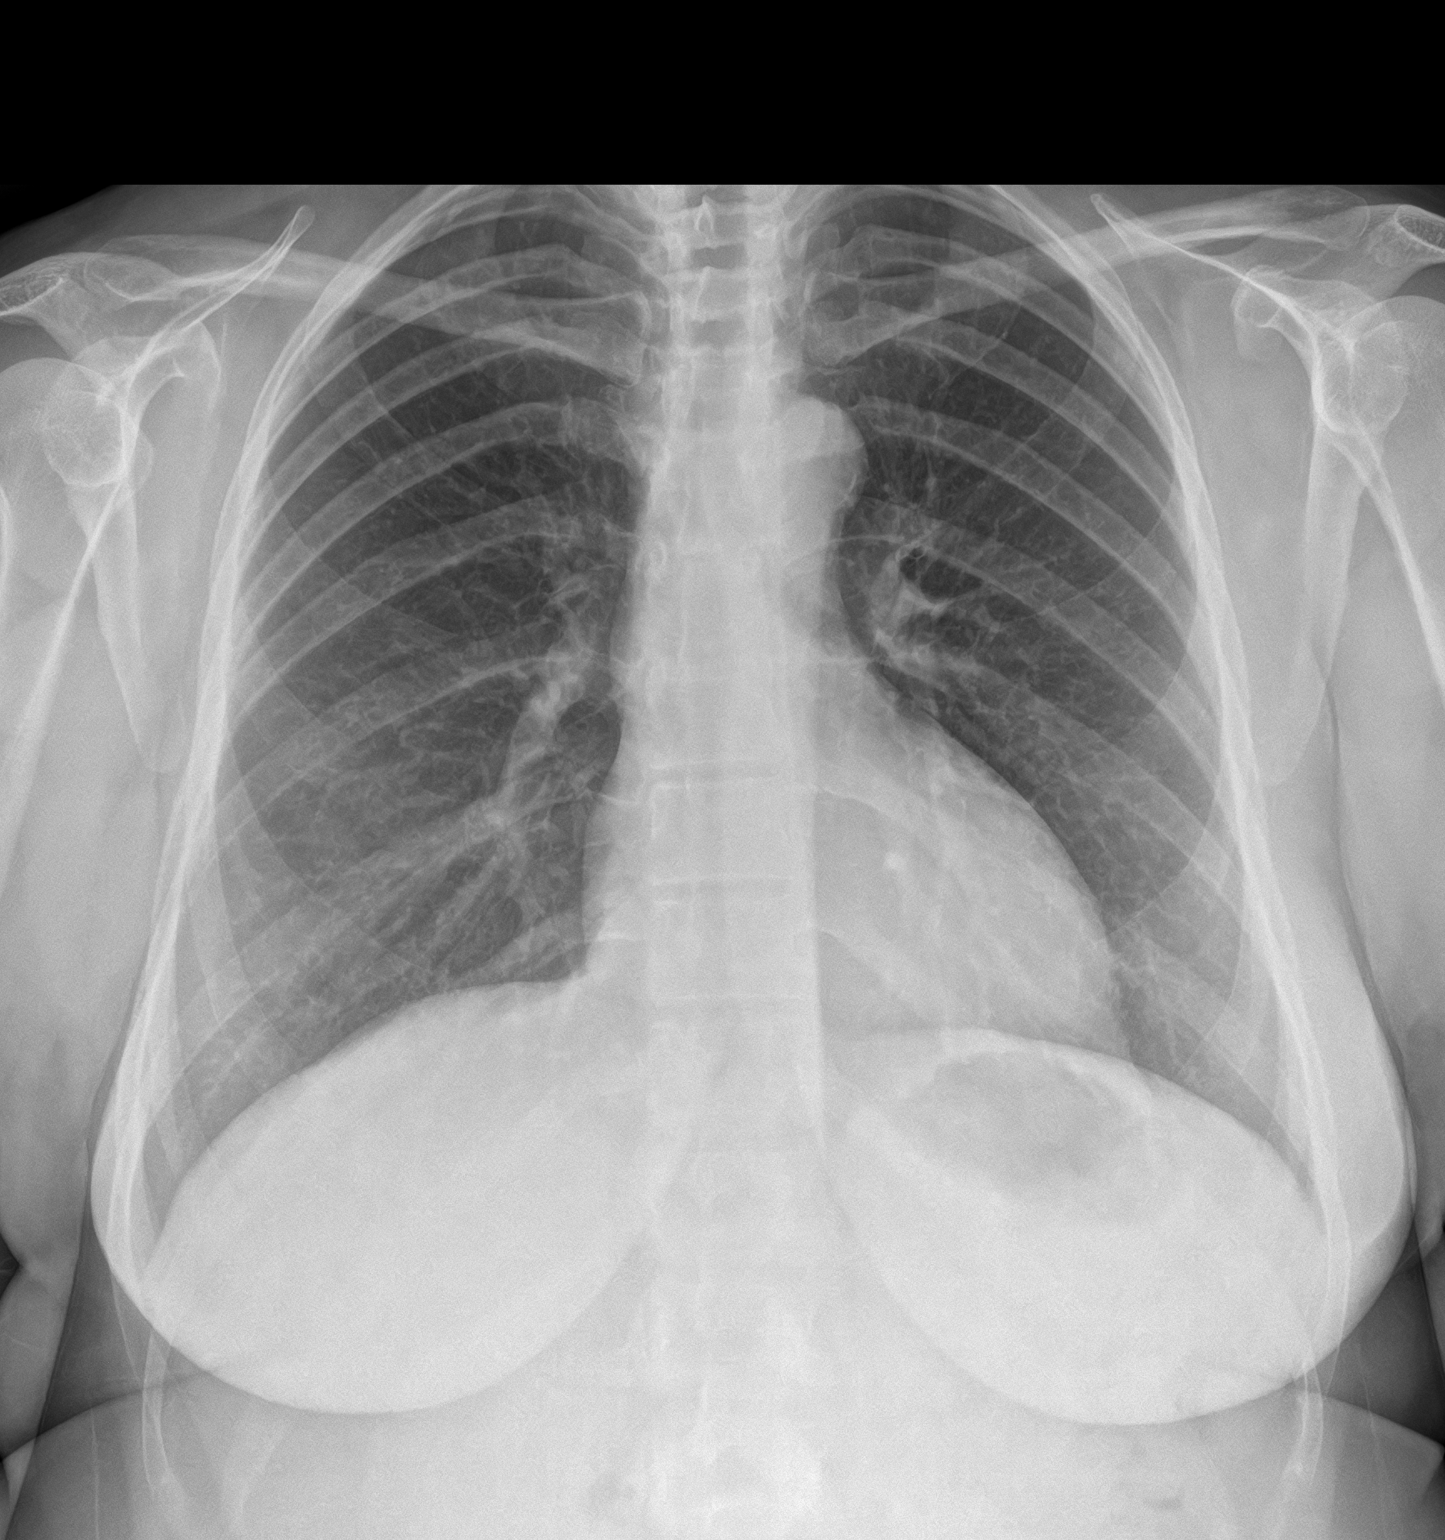
[im 2/2]
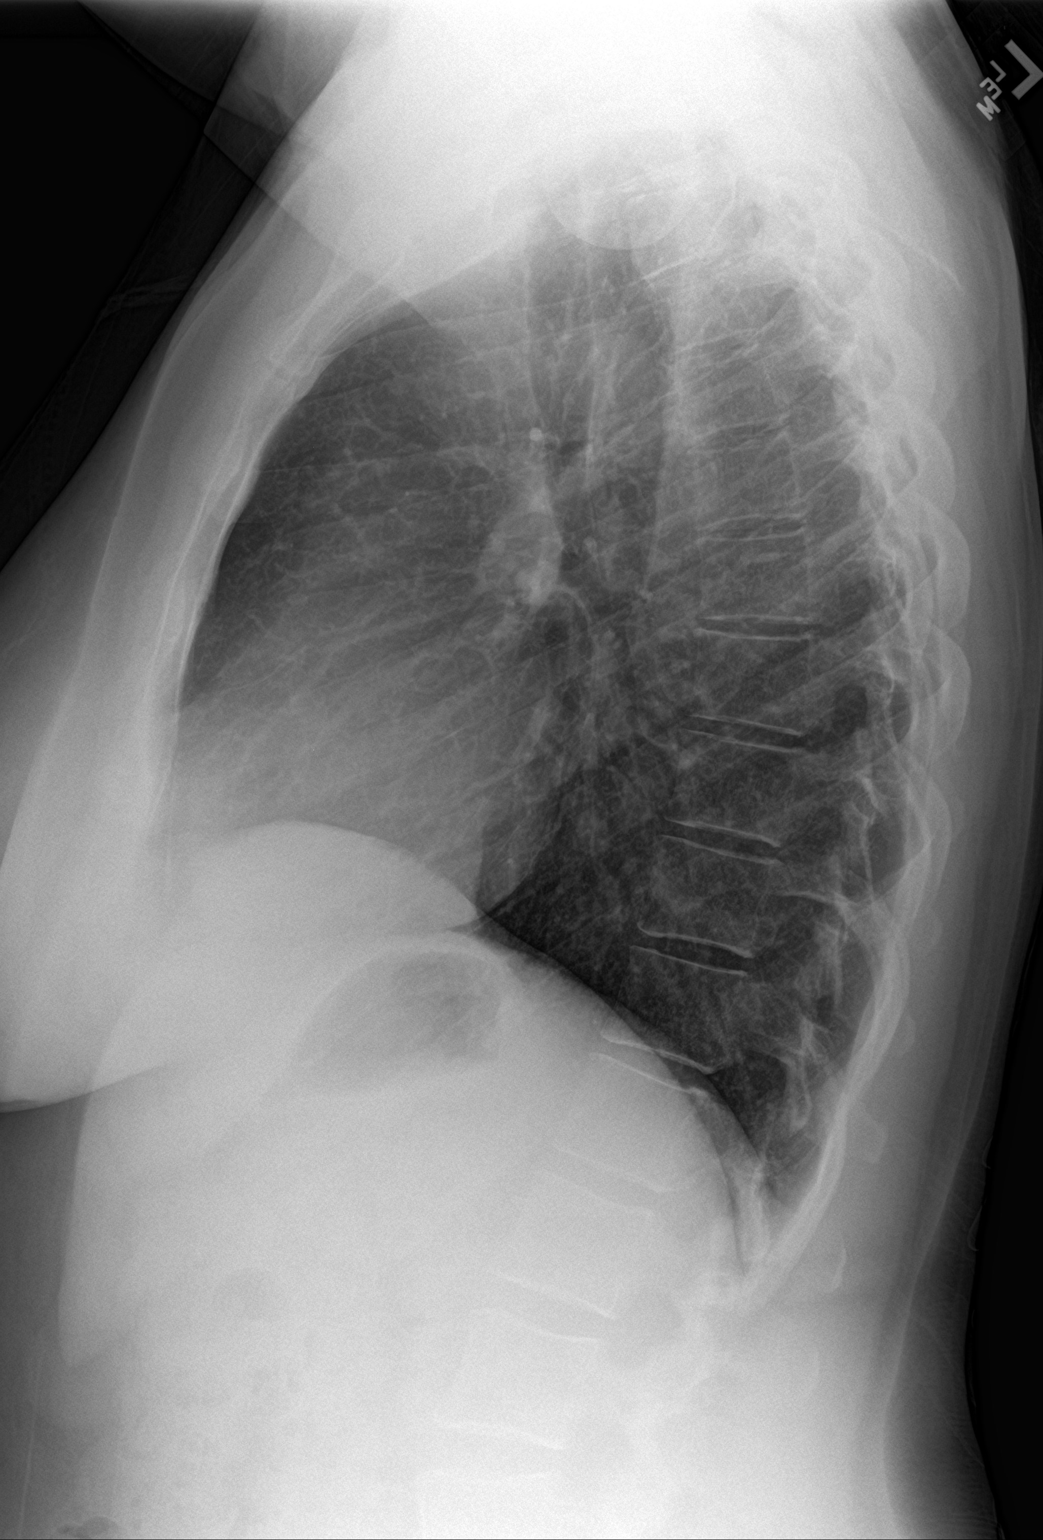

[2 of 2 positions shown; findings below may reference images not displayed]

FINDINGS: No active infiltrate or effusion is seen. Mediastinal and hilar
contours are unremarkable. The heart is within normal limits in
size. No bony abnormality is seen.
IMPRESSION: No active cardiopulmonary disease.

## 2018-12-13 ENCOUNTER — Other Ambulatory Visit: Payer: Self-pay | Admitting: Physician Assistant

## 2018-12-13 DIAGNOSIS — F320 Major depressive disorder, single episode, mild: Secondary | ICD-10-CM

## 2018-12-16 NOTE — Telephone Encounter (Signed)
Please review

## 2019-01-07 ENCOUNTER — Other Ambulatory Visit: Payer: Self-pay | Admitting: Physician Assistant

## 2019-01-07 DIAGNOSIS — E559 Vitamin D deficiency, unspecified: Secondary | ICD-10-CM

## 2019-01-07 MED ORDER — VITAMIN D (ERGOCALCIFEROL) 1.25 MG (50000 UNIT) PO CAPS: ORAL_CAPSULE | ORAL | 1 refills | Status: DC

## 2019-01-07 NOTE — Telephone Encounter (Signed)
[facility] faxed refill request for the following medications:  Vitamin D, Ergocalciferol, (DRISDOL) 1.25 MG (50000 UT) CAPS capsule   Please advise. Thanks TNP

## 2019-01-29 ENCOUNTER — Other Ambulatory Visit: Payer: Self-pay | Admitting: Physician Assistant

## 2019-01-29 DIAGNOSIS — F320 Major depressive disorder, single episode, mild: Secondary | ICD-10-CM

## 2019-01-29 DIAGNOSIS — E559 Vitamin D deficiency, unspecified: Secondary | ICD-10-CM

## 2019-01-29 DIAGNOSIS — Z1231 Encounter for screening mammogram for malignant neoplasm of breast: Secondary | ICD-10-CM

## 2019-01-29 MED ORDER — VENLAFAXINE HCL ER 150 MG PO CP24: ORAL_CAPSULE | ORAL | 1 refills | Status: DC

## 2019-01-29 MED ORDER — VITAMIN D (ERGOCALCIFEROL) 1.25 MG (50000 UNIT) PO CAPS: ORAL_CAPSULE | ORAL | 1 refills | Status: DC

## 2019-01-29 NOTE — Telephone Encounter (Signed)
[facility] faxed refill request for the following medications:  venlafaxine XR (EFFEXOR-XR) 150 MG 24 hr capsule  Vitamin D, Ergocalciferol, (DRISDOL) 1.25 MG (50000 UT) CAPS capsule   Please advise.

## 2019-02-09 ENCOUNTER — Ambulatory Visit (INDEPENDENT_AMBULATORY_CARE_PROVIDER_SITE_OTHER): Payer: 59 | Admitting: Physician Assistant

## 2019-02-09 ENCOUNTER — Encounter: Payer: Self-pay | Admitting: Physician Assistant

## 2019-02-09 DIAGNOSIS — G43009 Migraine without aura, not intractable, without status migrainosus: Secondary | ICD-10-CM | POA: Diagnosis not present

## 2019-02-09 DIAGNOSIS — F320 Major depressive disorder, single episode, mild: Secondary | ICD-10-CM

## 2019-02-09 DIAGNOSIS — R110 Nausea: Secondary | ICD-10-CM | POA: Diagnosis not present

## 2019-02-09 MED ORDER — SUMATRIPTAN SUCCINATE 100 MG PO TABS: 100.0000 mg | ORAL_TABLET | ORAL | 0 refills | Status: DC | PRN

## 2019-02-09 MED ORDER — ONDANSETRON HCL 4 MG PO TABS: 4.0000 mg | ORAL_TABLET | Freq: Three times a day (TID) | ORAL | 0 refills | Status: DC | PRN

## 2019-02-09 MED ORDER — VENLAFAXINE HCL ER 150 MG PO CP24: ORAL_CAPSULE | ORAL | 0 refills | Status: DC
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
## 2019-02-09 NOTE — Patient Instructions (Signed)
Migraine Headache A migraine headache is an intense, throbbing pain on one side or both sides of the head. Migraine headaches may also cause other symptoms, such as nausea, vomiting, and sensitivity to light and noise. A migraine headache can last from 4 hours to 3 days. Talk with your doctor about what things may bring on (trigger) your migraine headaches. What are the causes? The exact cause of this condition is not known. However, a migraine may be caused when nerves in the brain become irritated and release chemicals that cause inflammation of blood vessels. This inflammation causes pain. This condition may be triggered or caused by:  Drinking alcohol.  Smoking.  Taking medicines, such as: ? Medicine used to treat chest pain (nitroglycerin). ? Birth control pills. ? Estrogen. ? Certain blood pressure medicines.  Eating or drinking products that contain nitrates, glutamate, aspartame, or tyramine. Aged cheeses, chocolate, or caffeine may also be triggers.  Doing physical activity. Other things that may trigger a migraine headache include:  Menstruation.  Pregnancy.  Hunger.  Stress.  Lack of sleep or too much sleep.  Weather changes.  Fatigue. What increases the risk? The following factors may make you more likely to experience migraine headaches:  Being a certain age. This condition is more common in people who are 44-29 years old.  Being female.  Having a family history of migraine headaches.  Being Caucasian.  Having a mental health condition, such as depression or anxiety.  Being obese. What are the signs or symptoms? The main symptom of this condition is pulsating or throbbing pain. This pain may:  Happen in any area of the head, such as on one side or both sides.  Interfere with daily activities.  Get worse with physical activity.  Get worse with exposure to bright lights or loud noises. Other symptoms may include:  Nausea.  Vomiting.   Dizziness.  General sensitivity to bright lights, loud noises, or smells. Before you get a migraine headache, you may get warning signs (an aura). An aura may include:  Seeing flashing lights or having blind spots.  Seeing bright spots, halos, or zigzag lines.  Having tunnel vision or blurred vision.  Having numbness or a tingling feeling.  Having trouble talking.  Having muscle weakness. Some people have symptoms after a migraine headache (postdromal phase), such as:  Feeling tired.  Difficulty concentrating. How is this diagnosed? A migraine headache can be diagnosed based on:  Your symptoms.  A physical exam.  Tests, such as: ? CT scan or an MRI of the head. These imaging tests can help rule out other causes of headaches. ? Taking fluid from the spine (lumbar puncture) and analyzing it (cerebrospinal fluid analysis, or CSF analysis). How is this treated? This condition may be treated with medicines that:  Relieve pain.  Relieve nausea.  Prevent migraine headaches. Treatment for this condition may also include:  Acupuncture.  Lifestyle changes like avoiding foods that trigger migraine headaches.  Biofeedback.  Cognitive behavioral therapy. Follow these instructions at home: Medicines  Take over-the-counter and prescription medicines only as told by your health care provider.  Ask your health care provider if the medicine prescribed to you: ? Requires you to avoid driving or using heavy machinery. ? Can cause constipation. You may need to take these actions to prevent or treat constipation:  Drink enough fluid to keep your urine pale yellow.  Take over-the-counter or prescription medicines.  Eat foods that are high in fiber, such as beans, whole grains, and  fresh fruits and vegetables.  Limit foods that are high in fat and processed sugars, such as fried or sweet foods. Lifestyle  Do not drink alcohol.  Do not use any products that contain nicotine  or tobacco, such as cigarettes, e-cigarettes, and chewing tobacco. If you need help quitting, ask your health care provider.  Get at least 8 hours of sleep every night.  Find ways to manage stress, such as meditation, deep breathing, or yoga. General instructions      Keep a journal to find out what may trigger your migraine headaches. For example, write down: ? What you eat and drink. ? How much sleep you get. ? Any change to your diet or medicines.  If you have a migraine headache: ? Avoid things that make your symptoms worse, such as bright lights. ? It may help to lie down in a dark, quiet room. ? Do not drive or use heavy machinery. ? Ask your health care provider what activities are safe for you while you are experiencing symptoms.  Keep all follow-up visits as told by your health care provider. This is important. Contact a health care provider if:  You develop symptoms that are different or more severe than your usual migraine headache symptoms.  You have more than 15 headache days in one month. Get help right away if:  Your migraine headache becomes severe.  Your migraine headache lasts longer than 72 hours.  You have a fever.  You have a stiff neck.  You have vision loss.  Your muscles feel weak or like you cannot control them.  You start to lose your balance often.  You have trouble walking.  You faint.  You have a seizure. Summary  A migraine headache is an intense, throbbing pain on one side or both sides of the head. Migraines may also cause other symptoms, such as nausea, vomiting, and sensitivity to light and noise.  This condition may be treated with medicines and lifestyle changes. You may also need to avoid certain things that trigger a migraine headache.  Keep a journal to find out what may trigger your migraine headaches.  Contact your health care provider if you have more than 15 headache days in a month or you develop symptoms that are  different or more severe than your usual migraine headache symptoms. This information is not intended to replace advice given to you by your health care provider. Make sure you discuss any questions you have with your health care provider. Document Released: 07/09/2005 Document Revised: 10/31/2018 Document Reviewed: 08/21/2018 Elsevier Patient Education  [place]. Sumatriptan tablets What is this medicine? SUMATRIPTAN (soo ma TRIP tan) is used to treat migraines with or without aura. An aura is a strange feeling or visual disturbance that warns you of an attack. It is not used to prevent migraines. This medicine may be used for other purposes; ask your health care provider or pharmacist if you have questions. COMMON BRAND NAME(S): Imitrex, Migraine Pack What should I tell my health care provider before I take this medicine? They need to know if you have any of these conditions:  cigarette smoker  circulation problems in fingers and toes  diabetes  heart disease  high blood pressure  high cholesterol  history of irregular heartbeat  history of stroke  kidney disease  liver disease  stomach or intestine problems  an unusual or allergic reaction to sumatriptan, other medicines, foods, dyes, or preservatives  pregnant or trying to get pregnant  breast-feeding How should I use this medicine? Take this medicine by mouth with a glass of water. Follow the directions on the prescription label. Do not take it more often than directed. Talk to your pediatrician regarding the use of this medicine in children. Special care may be needed. Overdosage: If you think you have taken too much of this medicine contact a poison control center or emergency room at once. NOTE: This medicine is only for you. Do not share this medicine with others. What if I miss a dose? This does not apply. This medicine is not for regular use. What may interact with this medicine? Do not take this  medicine with any of the following medicines:  certain medicines for migraine headache like almotriptan, eletriptan, frovatriptan, naratriptan, rizatriptan, sumatriptan, zolmitriptan  ergot alkaloids like dihydroergotamine, ergonovine, ergotamine, methylergonovine  MAOIs like Carbex, Eldepryl, Marplan, Nardil, and Parnate This medicine may also interact with the following medications:  certain medicines for depression, anxiety, or psychotic disorders This list may not describe all possible interactions. Give your health care provider a list of all the medicines, herbs, non-prescription drugs, or dietary supplements you use. Also tell them if you smoke, drink alcohol, or use illegal drugs. Some items may interact with your medicine. What should I watch for while using this medicine? Visit your healthcare professional for regular checks on your progress. Tell your healthcare professional if your symptoms do not start to get better or if they get worse. You may get drowsy or dizzy. Do not drive, use machinery, or do anything that needs mental alertness until you know how this medicine affects you. Do not stand up or sit up quickly, especially if you are an older patient. This reduces the risk of dizzy or fainting spells. Alcohol may interfere with the effect of this medicine. Tell your healthcare professional right away if you have any change in your eyesight. If you take migraine medicines for 10 or more days a month, your migraines may get worse. Keep a diary of headache days and medicine use. Contact your healthcare professional if your migraine attacks occur more frequently. What side effects may I notice from receiving this medicine? Side effects that you should report to your doctor or health care professional as soon as possible:  allergic reactions like skin rash, itching or hives, swelling of the face, lips, or tongue  changes in vision  chest pain or chest tightness  signs and  symptoms of a dangerous change in heartbeat or heart rhythm like chest pain; dizziness; fast, irregular heartbeat; palpitations; feeling faint or lightheaded; falls; breathing problems  signs and symptoms of a stroke like changes in vision; confusion; trouble speaking or understanding; severe headaches; sudden numbness or weakness of the face, arm or leg; trouble walking; dizziness; loss of balance or coordination  signs and symptoms of serotonin syndrome like irritable; confusion; diarrhea; fast or irregular heartbeat; muscle twitching; stiff muscles; trouble walking; sweating; high fever; seizures; chills; vomiting Side effects that usually do not require medical attention (report to your doctor or health care professional if they continue or are bothersome):  diarrhea  dizziness  drowsiness  dry mouth  headache  nausea, vomiting  pain, tingling, numbness in the hands or feet  stomach pain This list may not describe all possible side effects. Call your doctor for medical advice about side effects. You may report side effects to FDA at 1-800-FDA-1088. Where should I keep my medicine? Keep out of the reach of children. Store at room temperature  between 2 and 30 degrees C (36 and 86 degrees F). Throw away any unused medicine after the expiration date. NOTE: This sheet is a summary. It may not cover all possible information. If you have questions about this medicine, talk to your doctor, pharmacist, or health care provider.  2020 Elsevier/Gold Standard (2018-01-21 15:05:37)

## 2019-03-16 ENCOUNTER — Ambulatory Visit
Admission: RE | Admit: 2019-03-16 | Discharge: 2019-03-16 | Disposition: A | Discharge status: Home / Self Care | Payer: 59 | Source: Ambulatory Visit | Attending: Physician Assistant | Admitting: Physician Assistant

## 2019-03-16 ENCOUNTER — Other Ambulatory Visit: Payer: Self-pay

## 2019-03-16 DIAGNOSIS — Z1231 Encounter for screening mammogram for malignant neoplasm of breast: Secondary | ICD-10-CM | POA: Insufficient documentation

## 2019-03-18 ENCOUNTER — Telehealth: Payer: Self-pay

## 2019-03-18 NOTE — Telephone Encounter (Signed)
-----   Message from Mar Daring, [place] sent at 03/18/2019  8:59 AM EDT ----- Normal mammogram. Repeat screening in one year.

## 2019-03-18 NOTE — Telephone Encounter (Signed)
Left message for patient to call regarding results. 

## 2019-03-24 ENCOUNTER — Telehealth: Payer: Self-pay

## 2019-03-24 NOTE — Telephone Encounter (Signed)
LMTCB regarding results.  

## 2019-03-24 NOTE — Telephone Encounter (Signed)
Encounter error

## 2019-05-28 ENCOUNTER — Other Ambulatory Visit: Payer: Self-pay | Admitting: Physician Assistant

## 2019-05-28 DIAGNOSIS — F320 Major depressive disorder, single episode, mild: Secondary | ICD-10-CM

## 2019-07-31 ENCOUNTER — Other Ambulatory Visit: Payer: Self-pay | Admitting: Physician Assistant

## 2019-07-31 DIAGNOSIS — E559 Vitamin D deficiency, unspecified: Secondary | ICD-10-CM

## 2019-08-06 ENCOUNTER — Other Ambulatory Visit: Payer: Self-pay | Admitting: Physician Assistant

## 2019-08-06 DIAGNOSIS — E559 Vitamin D deficiency, unspecified: Secondary | ICD-10-CM

## 2019-08-26 IMAGING — MG MM DIGITAL SCREENING BILAT W/ CAD
4 series · 4 of 4 positions shown · non-contrast
Comparison: Previous exam(s).

CLINICAL DATA: Screening.

EXAM:
DIGITAL SCREENING BILATERAL MAMMOGRAM WITH CAD

[L CC]
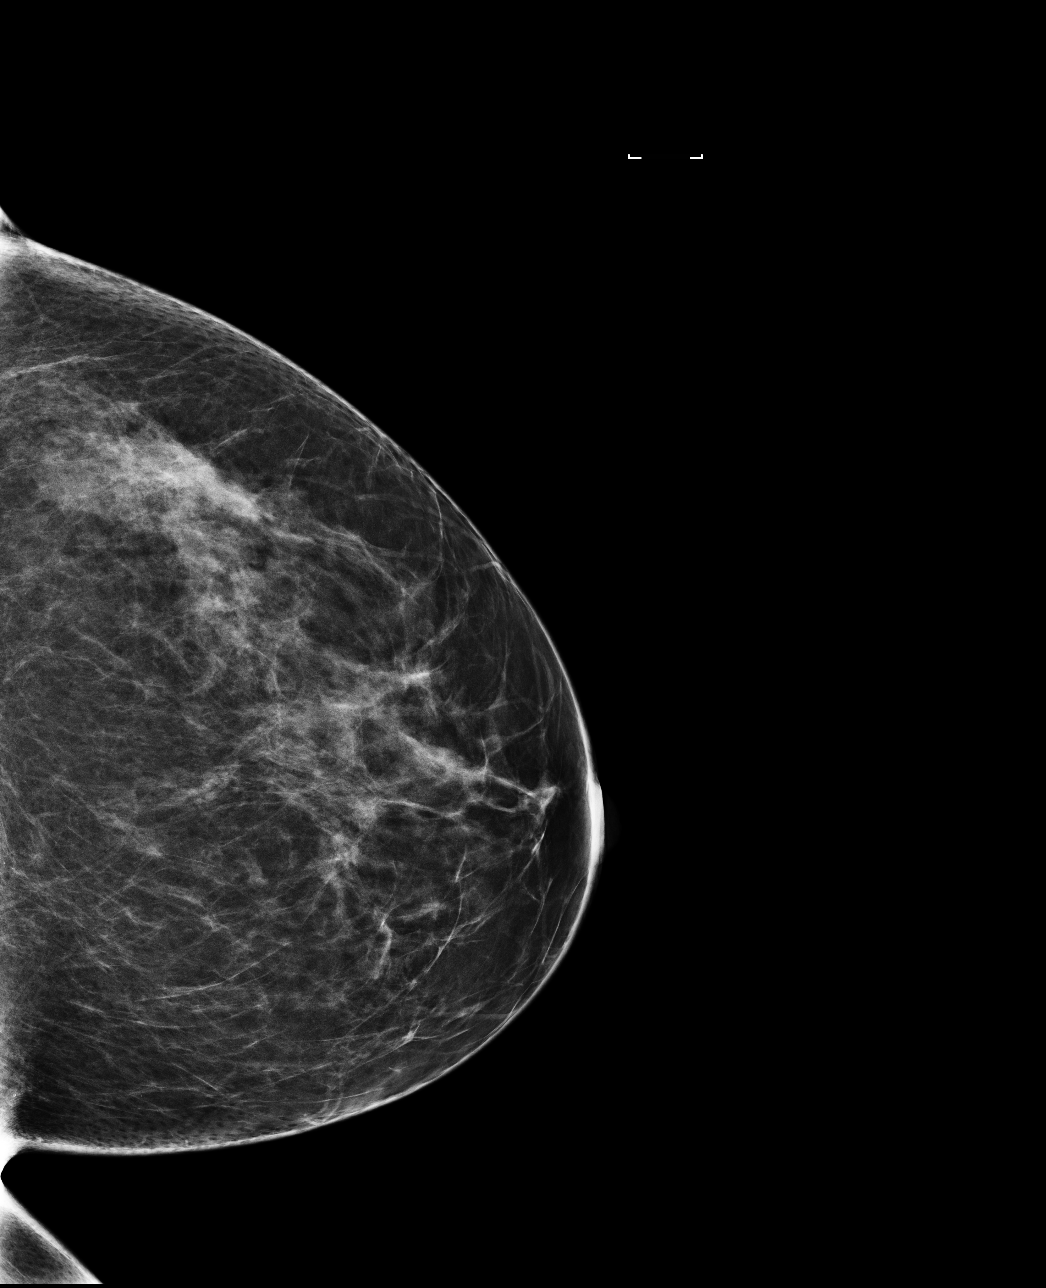

[L MLO]
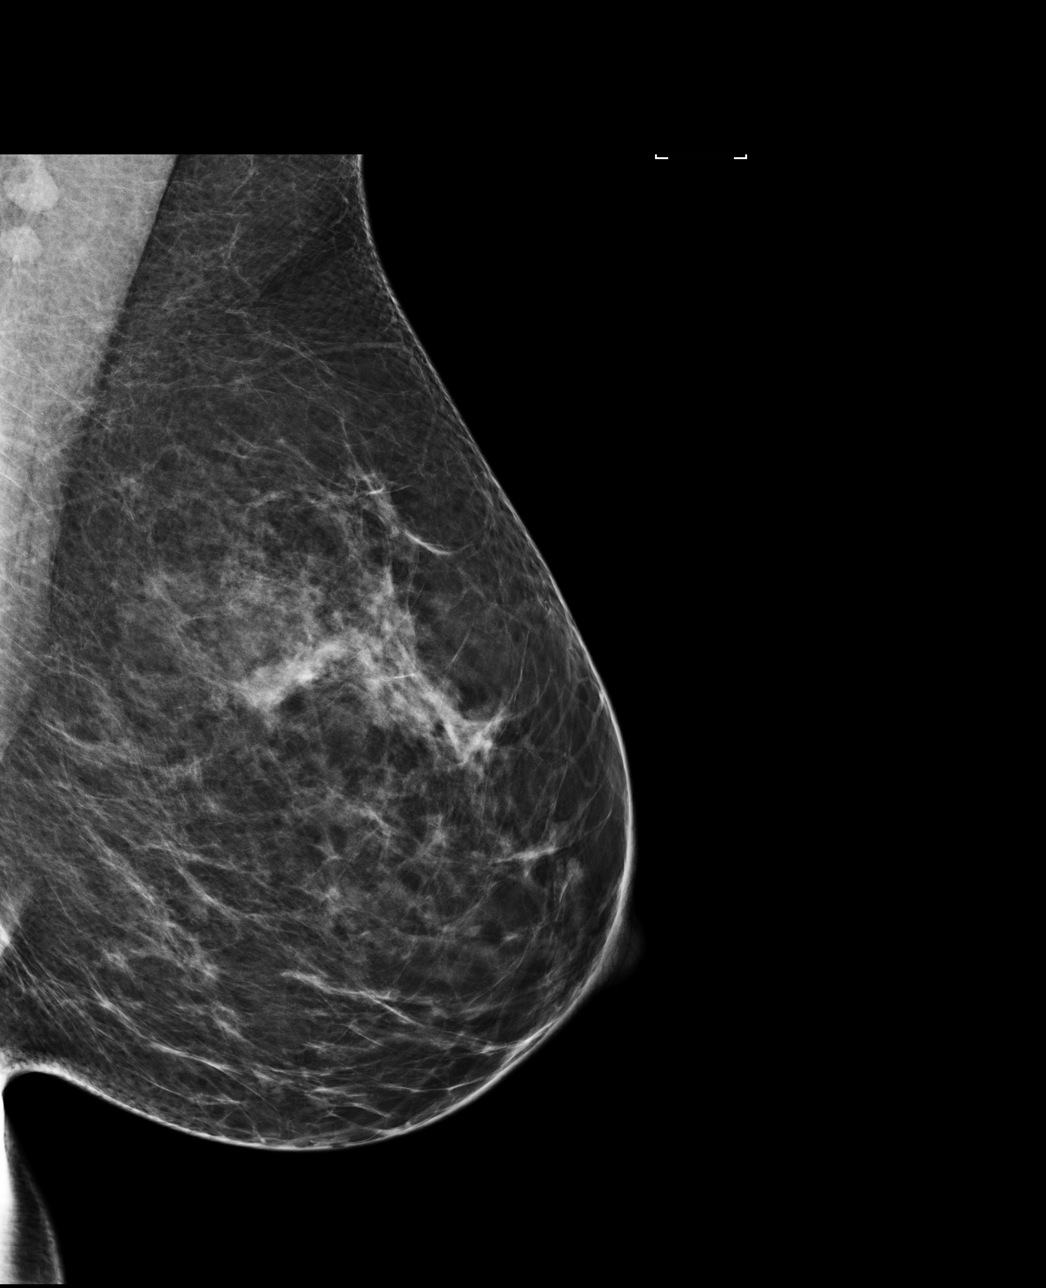

[R MLO]
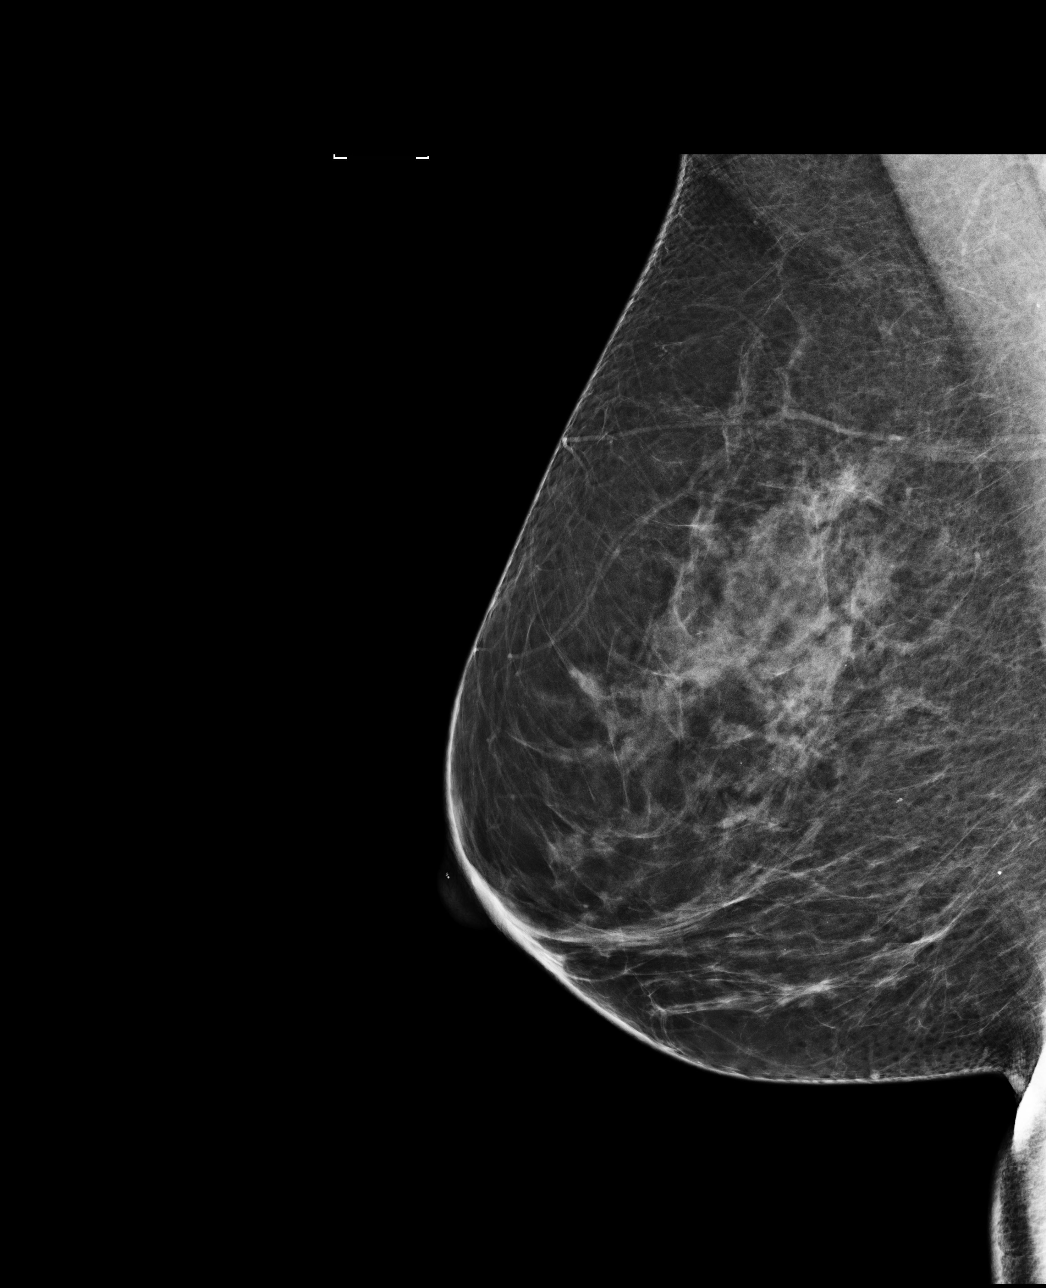

[R CC]
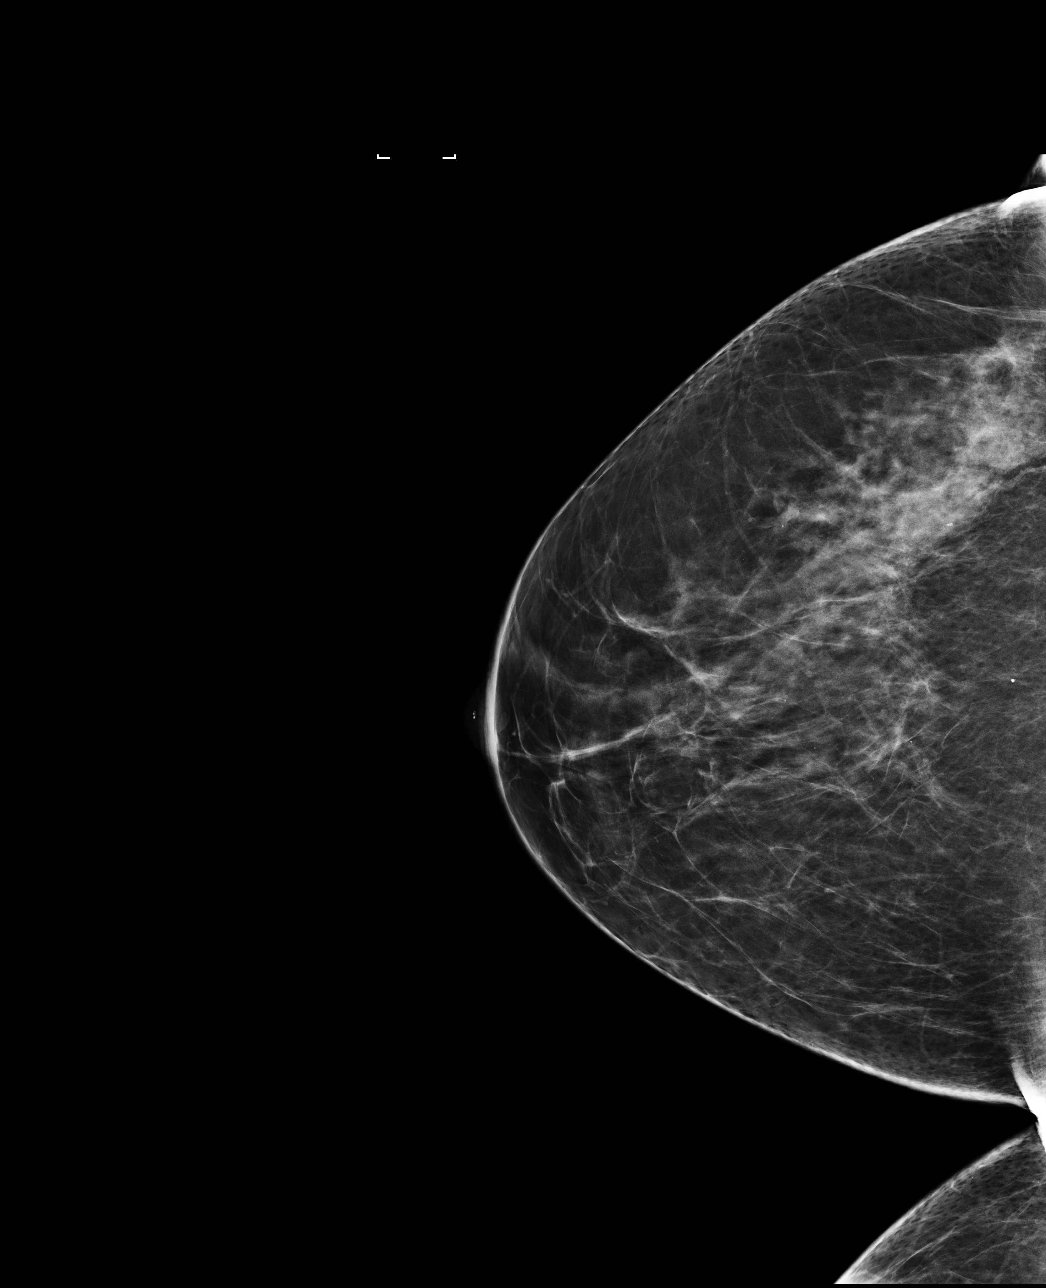

[4 of 4 positions shown; findings below may reference images not displayed]

ACR Breast Density Category b: There are scattered areas of
fibroglandular density.
FINDINGS: There are no findings suspicious for malignancy. Images were
processed with CAD.
IMPRESSION: No mammographic evidence of malignancy. A result letter of this
screening mammogram will be mailed directly to the patient.

RECOMMENDATION:
Screening mammogram in one year. (Code:AS-G-LCT)

BI-RADS CATEGORY  1: Negative.

## 2019-09-28 ENCOUNTER — Other Ambulatory Visit: Payer: Self-pay | Admitting: Family Medicine

## 2019-09-28 DIAGNOSIS — F320 Major depressive disorder, single episode, mild: Secondary | ICD-10-CM

## 2019-09-28 NOTE — Telephone Encounter (Addendum)
Pt refill request for Venlafaxine; no valid encounter within last 6 months; no upcoming visits noted; attempted to contact pt; left message on voicemail; 30 day courtesy refill granted; will route to office for notification. Requested Prescriptions  Pending Prescriptions Disp Refills  . venlafaxine XR (EFFEXOR-XR) 150 MG 24 hr capsule [Pharmacy Med Name: VENLAFAXINE HCL ER 150 MG CAP] 90 capsule 0    Sig: TAKE 1 CAPSULE BY MOUTH EVERY DAY WITH BREAKFAST     Psychiatry: Antidepressants - SNRI - desvenlafaxine & venlafaxine Failed - 09/28/2019  9:48 AM      Failed - LDL in normal range and within 360 days    LDL Calculated  Date Value Ref Range Status  07/09/2018 184 (H) 0 - 99 mg/dL Final         Failed - Total Cholesterol in normal range and within 360 days    Cholesterol, Total  Date Value Ref Range Status  07/09/2018 282 (H) 100 - 199 mg/dL Final         Failed - Triglycerides in normal range and within 360 days    Triglycerides  Date Value Ref Range Status  07/09/2018 168 (H) 0 - 149 mg/dL Final         Failed - Completed PHQ-2 or PHQ-9 in the last 360 days.      Failed - Valid encounter within last 6 months    Recent Outpatient Visits          7 months ago Migraine without aura and without status migrainosus, not intractable   [place], [place] Sorrel, [place]   1 year ago Annual physical exam   [facility] [place], Clearnce Sorrel, [place]   1 year ago Viral URI   [place], [place], [place]   2 years ago Chest pain, unspecified type   [facility], [place], [place]   3 years ago Breast mass in female   [place], [place]             Passed - Last BP in normal range    BP Readings from Last 1 Encounters:  08/13/18 114/68

## 2019-10-09 NOTE — Progress Notes (Signed)
Patient: Kristi Dominguez Female    DOB: 25-Mar-1966   54 y.o.   MRN: [id number] Visit Date: 10/12/2019  Today's Provider: Mar Daring, PA-C   Chief Complaint  Patient presents with  . Depression    follow up   Subjective:     HPI  Patient here for Depression follow-up. She reports that her symptoms are stable. She has been handling stressors through the covid-19 pandemic well. 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 Her youngest son is still going to in person classes at [employer].   There have been increasing family stressors as well. She and her oldest sister are not on well speaking terms. She reports they all "talk through lawyers". There has been a lot of stress and drama over the estate of their father. He is still living but has been declining over the last couple of years.   Wt Readings from Last 3 Encounters:  10/12/19 176 lb 3.2 oz (79.9 kg)  08/13/18 185 lb (83.9 kg)  07/09/18 189 lb 9.6 oz (86 kg)    Allergies  Allergen Reactions  . Tetracycline Other (See Comments)     Current Outpatient Medications:  .  venlafaxine XR (EFFEXOR-XR) 150 MG 24 hr capsule, TAKE 1 CAPSULE BY MOUTH EVERY DAY WITH BREAKFAST, Disp: 30 capsule, Rfl: 0 .  Vitamin D, Ergocalciferol, (DRISDOL) 1.25 MG (50000 UNIT) CAPS capsule, TAKE 1 CAPSULE EVERY 7 DAYS, Disp: 12 capsule, Rfl: 1 .  cetirizine (ZYRTEC) 10 MG tablet, Take 10 mg by mouth daily., Disp: , Rfl:  .  ondansetron (ZOFRAN) 4 MG tablet, Take 1 tablet (4 mg total) by mouth every 8 (eight) hours as needed. (Patient not taking: Reported on 10/12/2019), Disp: 20 tablet, Rfl: 0 .  SUMAtriptan (IMITREX) 100 MG tablet, Take 1 tablet (100 mg total) by mouth every 2 (two) hours as needed for migraine. Do NOT take more than 200mg  in 24 hours (Patient not taking: Reported on 10/12/2019), Disp: 10 tablet, Rfl: 0  Review of Systems  Constitutional: Negative.   Respiratory:  Negative.   Cardiovascular: Negative.   Neurological: Negative.   Psychiatric/Behavioral: Negative.     Social History   Tobacco Use  . Smoking status: Never Smoker  . Smokeless tobacco: Never Used  Substance Use Topics  . Alcohol use: No    Alcohol/week: 0.0 standard drinks      Objective:   BP 124/85 (BP Location: Left Arm, Patient Position: Sitting, Cuff Size: Large)   Pulse 90   Temp (!) 97.2 F (36.2 C) (Temporal)   Resp 16   Ht 5\' 7"  (1.702 m)   Wt 176 lb 3.2 oz (79.9 kg)   LMP 10/07/2019   BMI 27.60 kg/m  Vitals:   10/12/19 1628  BP: 124/85  Pulse: 90  Resp: 16  Temp: (!) 97.2 F (36.2 C)  TempSrc: Temporal  Weight: 176 lb 3.2 oz (79.9 kg)  Height: 5\' 7"  (1.702 m)  Body mass index is 27.6 kg/m.   Physical Exam Vitals reviewed.  Constitutional:      General: She is not in acute distress.    Appearance: Normal appearance. She is well-developed. She is not ill-appearing or diaphoretic.  Cardiovascular:     Rate and Rhythm: Normal rate and regular rhythm.     Pulses: Normal pulses.     Heart sounds: Normal heart sounds. No murmur. No friction rub. No gallop.   Pulmonary:  Effort: Pulmonary effort is normal. No respiratory distress.     Breath sounds: Normal breath sounds. No wheezing or rales.  Musculoskeletal:     Cervical back: Normal range of motion and neck supple.  Neurological:     Mental Status: She is alert.      No results found for any visits on 10/12/19.     Assessment & Plan    1. Recurrent major depressive disorder, in partial remission (HCC) Stable. Continue Venlafaxine XR 150mg . Will check labs as below and f/u pending results. - CBC w/Diff/Platelet - Comprehensive Metabolic Panel (CMET) - venlafaxine XR (EFFEXOR-XR) 150 MG 24 hr capsule; Take 1 capsule (150 mg total) by mouth daily with breakfast.  Dispense: 90 capsule; Refill: 3  2. Hypercholesterolemia Diet controlled. Will check labs as below and f/u pending  results. - CBC w/Diff/Platelet - Comprehensive Metabolic Panel (CMET) - Lipid Panel With LDL/HDL Ratio - HgB A1c  3. Vitamin D deficiency H/O this. Will check labs as below and f/u pending results. - Vitamin D (25 hydroxy) - Vitamin D, Ergocalciferol, (DRISDOL) 1.25 MG (50000 UNIT) CAPS capsule; TAKE 1 CAPSULE EVERY 7 DAYS  Dispense: 12 capsule; Refill: 1  4. Thyroid disorder screening Will check labs as below and f/u pending results. - TSH  5. Diabetes mellitus screening Will check labs as below and f/u pending results. - HgB A1c     Mar Daring, PA-C  [facility]

## 2019-10-12 ENCOUNTER — Ambulatory Visit: Payer: BC Managed Care – PPO | Admitting: Physician Assistant

## 2019-10-12 ENCOUNTER — Other Ambulatory Visit: Payer: Self-pay

## 2019-10-12 ENCOUNTER — Encounter: Payer: Self-pay | Admitting: Physician Assistant

## 2019-10-12 VITALS — BP 124/85 | HR 90 | Temp 97.2°F | Resp 16 | Ht 67.0 in | Wt 176.2 lb

## 2019-10-12 DIAGNOSIS — Z1329 Encounter for screening for other suspected endocrine disorder: Secondary | ICD-10-CM | POA: Diagnosis not present

## 2019-10-12 DIAGNOSIS — E559 Vitamin D deficiency, unspecified: Secondary | ICD-10-CM | POA: Diagnosis not present

## 2019-10-12 DIAGNOSIS — E7800 Pure hypercholesterolemia, unspecified: Secondary | ICD-10-CM

## 2019-10-12 DIAGNOSIS — F3341 Major depressive disorder, recurrent, in partial remission: Secondary | ICD-10-CM

## 2019-10-12 DIAGNOSIS — Z131 Encounter for screening for diabetes mellitus: Secondary | ICD-10-CM

## 2019-10-12 MED ORDER — VITAMIN D (ERGOCALCIFEROL) 1.25 MG (50000 UNIT) PO CAPS: ORAL_CAPSULE | ORAL | 1 refills | Status: DC

## 2019-10-12 MED ORDER — VENLAFAXINE HCL ER 150 MG PO CP24: 150.0000 mg | ORAL_CAPSULE | Freq: Every day | ORAL | 3 refills | Status: DC

## 2019-10-12 NOTE — Patient Instructions (Signed)
10 Relaxation Techniques That Zap Stress Fast By Jeannette Moninger   Listen  Relax. You deserve it, it's good for you, and it takes less time than you think. You don't need a spa weekend or a retreat. Each of these stress-relieving tips can get you from OMG to om in less than 15 minutes. 1. Meditate  A few minutes of practice per day can help ease anxiety. "Research suggests that daily meditation may alter the brain's neural pathways, making you more resilient to stress," says psychologist Robbie Maller Hartman, PhD, a [place] health and wellness coach. It's simple. Sit up straight with both feet on the floor. Close your eyes. Focus your attention on reciting -- out loud or silently -- a positive mantra such as "I feel at peace" or "I love myself." Place one hand on your belly to sync the mantra with your breaths. Let any distracting thoughts float by like clouds. 2. Breathe Deeply  Take a 5-minute break and focus on your breathing. Sit up straight, eyes closed, with a hand on your belly. Slowly inhale through your nose, feeling the breath start in your abdomen and work its way to the top of your head. Reverse the process as you exhale through your mouth.  "Deep breathing counters the effects of stress by slowing the heart rate and lowering blood pressure," psychologist Judith Tutin, PhD, says. She's a certified life coach in [place], [place] 3. Be Present  Slow down.  "Take 5 minutes and focus on only one behavior with awareness," Tutin says. Notice how the air feels on your face when you're walking and how your feet feel hitting the ground. Enjoy the texture and taste of each bite of food. When you spend time in the moment and focus on your senses, you should feel less tense. 4. Reach Out  Your social network is one of your best tools for handling stress. Talk to others -- preferably face to face, or at least on the phone. Share what's going on. You can get a fresh perspective while keeping your  connection strong. 5. Tune In to Your Body  Mentally scan your body to get a sense of how stress affects it each day. Lie on your back, or sit with your feet on the floor. Start at your toes and work your way up to your scalp, noticing how your body feels.  "Simply be aware of places you feel tight or loose without trying to change anything," Tutin says. For 1 to 2 minutes, imagine each deep breath flowing to that body part. Repeat this process as you move your focus up your body, paying close attention to sensations you feel in each body part. 6. Decompress  Place a warm heat wrap around your neck and shoulders for 10 minutes. Close your eyes and relax your face, neck, upper chest, and back muscles. Remove the wrap, and use a tennis ball or foam roller to massage away tension.  "Place the ball between your back and the wall. Lean into the ball, and hold gentle pressure for up to 15 seconds. Then move the ball to another spot, and apply pressure," says Cathy Benninger, a nurse practitioner and assistant professor at [employer] [employer] in [place]. 7. Laugh Out Loud  A good belly laugh doesn't just lighten the load mentally. It lowers cortisol, your body's stress hormone, and boosts brain chemicals called endorphins, which help your mood. Lighten up by tuning in to your favorite sitcom or video, reading   the comics, or chatting with someone who makes you smile. 8. Crank Up the Tunes  Research shows that listening to soothing music can lower blood pressure, heart rate, and anxiety. "Create a playlist of songs or nature sounds (the ocean, a bubbling brook, birds chirping), and allow your mind to focus on the different melodies, instruments, or singers in the piece," Benninger says. You also can blow off steam by rocking out to more upbeat tunes -- or singing at the top of your lungs! 9. Get Moving  You don't have to run in order to get a runner's high. All forms of exercise,  including yoga and walking, can ease depression and anxiety by helping the brain release feel-good chemicals and by giving your body a chance to practice dealing with stress. You can go for a quick walk around the block, take the stairs up and down a few flights, or do some stretching exercises like head rolls and shoulder shrugs. 10. Be Grateful  Keep a gratitude journal or several (one by your bed, one in your purse, and one at work) to help you remember all the things that are good in your life.  "Being grateful for your blessings cancels out negative thoughts and worries," says Joni Emmerling, a wellness coach in [place], [place].  Use these journals to savor good experiences like a child's smile, a sunshine-filled day, and good health. Don't forget to celebrate accomplishments like mastering a new task at work or a new hobby. When you start feeling stressed, spend a few minutes looking through your notes to remind yourself what really matters.   

## 2019-10-20 DIAGNOSIS — Z1329 Encounter for screening for other suspected endocrine disorder: Secondary | ICD-10-CM | POA: Diagnosis not present

## 2019-10-20 DIAGNOSIS — E7800 Pure hypercholesterolemia, unspecified: Secondary | ICD-10-CM | POA: Diagnosis not present

## 2019-10-20 DIAGNOSIS — F3341 Major depressive disorder, recurrent, in partial remission: Secondary | ICD-10-CM | POA: Diagnosis not present

## 2019-10-20 DIAGNOSIS — Z131 Encounter for screening for diabetes mellitus: Secondary | ICD-10-CM | POA: Diagnosis not present

## 2019-10-20 DIAGNOSIS — E559 Vitamin D deficiency, unspecified: Secondary | ICD-10-CM | POA: Diagnosis not present

## 2019-10-21 LAB — CBC WITH DIFFERENTIAL/PLATELET
Basophils Absolute: 0 10*3/uL (ref 0.0–0.2)
Basos: 1 %
EOS (ABSOLUTE): 0.1 10*3/uL (ref 0.0–0.4)
Eos: 4 %
Hematocrit: 42.2 % (ref 34.0–46.6)
Hemoglobin: 14.3 g/dL (ref 11.1–15.9)
Immature Grans (Abs): 0 10*3/uL (ref 0.0–0.1)
Immature Granulocytes: 0 %
Lymphocytes Absolute: 1.2 10*3/uL (ref 0.7–3.1)
Lymphs: 33 %
MCH: 30.9 pg (ref 26.6–33.0)
MCHC: 33.9 g/dL (ref 31.5–35.7)
MCV: 91 fL (ref 79–97)
Monocytes Absolute: 0.5 10*3/uL (ref 0.1–0.9)
Monocytes: 13 %
Neutrophils Absolute: 1.8 10*3/uL (ref 1.4–7.0)
Neutrophils: 49 %
Platelets: 237 10*3/uL (ref 150–450)
RBC: 4.63 x10E6/uL (ref 3.77–5.28)
RDW: 12.9 % (ref 11.7–15.4)
WBC: 3.6 10*3/uL (ref 3.4–10.8)

## 2019-10-21 LAB — COMPREHENSIVE METABOLIC PANEL
ALT: 17 IU/L (ref 0–32)
AST: 21 IU/L (ref 0–40)
Albumin/Globulin Ratio: 2 (ref 1.2–2.2)
Albumin: 4.3 g/dL (ref 3.8–4.9)
Alkaline Phosphatase: 56 IU/L (ref 39–117)
BUN/Creatinine Ratio: 15 (ref 9–23)
BUN: 13 mg/dL (ref 6–24)
Bilirubin Total: 0.2 mg/dL (ref 0.0–1.2)
CO2: 23 mmol/L (ref 20–29)
Calcium: 9.1 mg/dL (ref 8.7–10.2)
Chloride: 104 mmol/L (ref 96–106)
Creatinine, Ser: 0.87 mg/dL (ref 0.57–1.00)
GFR calc Af Amer: 89 mL/min/{1.73_m2} (ref >59)
GFR calc non Af Amer: 77 mL/min/{1.73_m2} (ref >59)
Globulin, Total: 2.2 g/dL (ref 1.5–4.5)
Glucose: 99 mg/dL (ref 65–99)
Potassium: 4.1 mmol/L (ref 3.5–5.2)
Sodium: 138 mmol/L (ref 134–144)
Total Protein: 6.5 g/dL (ref 6.0–8.5)

## 2019-10-21 LAB — LIPID PANEL WITH LDL/HDL RATIO
Cholesterol, Total: 233 mg/dL — ABNORMAL HIGH (ref 100–199)
HDL: 67 mg/dL (ref >39)
LDL Chol Calc (NIH): 152 mg/dL — ABNORMAL HIGH (ref 0–99)
LDL/HDL Ratio: 2.3 ratio (ref 0.0–3.2)
Triglycerides: 80 mg/dL (ref 0–149)
VLDL Cholesterol Cal: 14 mg/dL (ref 5–40)

## 2019-10-21 LAB — HEMOGLOBIN A1C
Est. average glucose Bld gHb Est-mCnc: 105 mg/dL
Hgb A1c MFr Bld: 5.3 % (ref 4.8–5.6)

## 2019-10-21 LAB — TSH: TSH: 3.14 u[IU]/mL (ref 0.450–4.500)

## 2019-10-21 LAB — VITAMIN D 25 HYDROXY (VIT D DEFICIENCY, FRACTURES): Vit D, 25-Hydroxy: 56 ng/mL (ref 30.0–100.0)

## 2019-10-22 ENCOUNTER — Telehealth: Payer: Self-pay

## 2019-10-22 NOTE — Telephone Encounter (Signed)
Patient advised as directed below. 

## 2019-10-22 NOTE — Telephone Encounter (Signed)
-----   Message from Mar Daring, [place] sent at 10/22/2019  1:13 PM EDT ----- Blood count is normal. Kidney and liver function are normal. Sodium, potassium and calcium are normal. Sugar/A1c are normal. Thyroid is normal. Cholesterol is borderline high, but much improved from labs last year. Keep up the good work on lifestyle changes. Vit D is also normal.

## 2020-02-10 ENCOUNTER — Other Ambulatory Visit: Payer: Self-pay

## 2020-02-10 ENCOUNTER — Ambulatory Visit: Payer: BC Managed Care – PPO | Admitting: Dermatology

## 2020-02-10 DIAGNOSIS — D485 Neoplasm of uncertain behavior of skin: Secondary | ICD-10-CM

## 2020-02-10 DIAGNOSIS — L280 Lichen simplex chronicus: Secondary | ICD-10-CM

## 2020-02-10 DIAGNOSIS — L720 Epidermal cyst: Secondary | ICD-10-CM | POA: Diagnosis not present

## 2020-02-10 MED ORDER — CLOBETASOL PROP EMOLLIENT BASE 0.05 % EX CREA: TOPICAL_CREAM | CUTANEOUS | 0 refills | Status: DC

## 2020-02-10 NOTE — Patient Instructions (Addendum)
Wound Care Instructions  1. Cleanse wound gently with soap and water once a day then pat dry with clean gauze. Apply a thing coat of Petrolatum (petroleum jelly, "Vaseline") over the wound (unless you have an allergy to this). We recommend that you use a new, sterile tube of Vaseline. Do not pick or remove scabs. Do not remove the yellow or white "healing tissue" from the base of the wound.  2. Cover the wound with fresh, clean, nonstick gauze and secure with paper tape. You may use Band-Aids in place of gauze and tape if the would is small enough, but would recommend trimming much of the tape off as there is often too much. Sometimes Band-Aids can irritate the skin.  3. You should call the office for your biopsy report after 1 week if you have not already been contacted.  4. If you experience any problems, such as abnormal amounts of bleeding, swelling, significant bruising, significant pain, or evidence of infection, please call the office immediately.  5. FOR ADULT SURGERY PATIENTS: If you need something for pain relief you may take 1 extra strength Tylenol (acetaminophen) AND 2 Ibuprofen (200mg  each) together every 4 hours as needed for pain. (do not take these if you are allergic to them or if you have a reason you should not take them.) Typically, you may only need pain medication for 1 to 3 days.   Recommend daily broad spectrum sunscreen SPF 30+ to sun-exposed areas, reapply every 2 hours as needed. Call for new or changing lesions.  Topical steroids (such as triamcinolone, fluocinolone, fluocinonide, mometasone, clobetasol, halobetasol, betamethasone, hydrocortisone) can cause thinning and lightening of the skin if they are used for too long in the same area. Your physician has selected the right strength medicine for your problem and area affected on the body. Please use your medication only as directed by your physician to prevent side effects.

## 2020-02-10 NOTE — Progress Notes (Signed)
   Follow-Up Visit   Subjective  Kristi Dominguez is a 54 y.o. female who presents for the following: Nevus.  Patient here today to have a few spots checked. One is on the right ankle, present for about 6 weeks, bleeds when shaving and it is growing. She does not remember having a bug bite there.  Another spot is on the right breast and is a knot under the skin. Present for a few years, she has recently lost about 30 pounds and it has become more prominent. She is up to date on her mammograms. Patient also has a lot of itching at right arm.   The following portions of the chart were reviewed this encounter and updated as appropriate:      Review of Systems:  No other skin or systemic complaints except as noted in HPI or Assessment and Plan.  Objective  Well appearing patient in no apparent distress; mood and affect are within normal limits.  A focused examination was performed including breast, legs, arms. Relevant physical exam findings are noted in the Assessment and Plan.  Objective  Left Ankle - medial: 97mm pink brown crusted papule     Objective  Right Medial Breast: 1.0cm firm subq nodule  Objective  Right elbow: Mild lichenification lateral elbow area   Assessment & Plan  Neoplasm of uncertain behavior of skin Left Ankle - medial  Skin / nail biopsy Type of biopsy: tangential   Informed consent: discussed and consent obtained   Patient was prepped and draped in usual sterile fashion: Area prepped with alcohol. Anesthesia: the lesion was anesthetized in a standard fashion   Anesthetic:  1% lidocaine w/ epinephrine 1-100,000 buffered w/ 8.4% NaHCO3 Instrument used: flexible razor blade   Hemostasis achieved with: pressure, aluminum chloride and electrodesiccation   Outcome: patient tolerated procedure well   Post-procedure details: wound care instructions given   Post-procedure details comment:  Ointment and small bandage applied  Specimen 1 - Surgical  pathology Differential Diagnosis: ISK vs Wart r/o SCC Check Margins: No 57mm pink brown crusted papule  Epidermal inclusion cyst Right Medial Breast  Reassured benign growth.  Recommend observation.  Discussed surgical excision in office if changes noted or symptomatic.   Discussed excision, including resulting scar.    Lichen simplex chronicus Right elbow  Start clobetasol cream to affected area right arm twice daily until itching rash cleared. Avoid face, groin, underarms  Avoid scratching, rubbing area.  Try cold pack, or slapping when itches.  Topical steroids (such as triamcinolone, fluocinolone, fluocinonide, mometasone, clobetasol, halobetasol, betamethasone, hydrocortisone) can cause thinning and lightening of the skin if they are used for too long in the same area. Your physician has selected the right strength medicine for your problem and area affected on the body. Please use your medication only as directed by your physician to prevent side effects.    Ordered Medications: Clobetasol Prop Emollient Base (CLOBETASOL PROPIONATE E) 0.05 % emollient cream  Return if symptoms worsen or fail to improve, for pending bx result.  Graciella Belton, RMA, am acting as scribe for Brendolyn Patty, MD . Documentation: I have reviewed the above documentation for accuracy and completeness, and I agree with the above.  Brendolyn Patty MD

## 2020-02-11 NOTE — Addendum Note (Signed)
Addended by: Brendolyn Patty on: 02/11/2020 04:03 PM   Modules accepted: Level of Service

## 2020-02-15 ENCOUNTER — Telehealth: Payer: Self-pay

## 2020-02-15 NOTE — Telephone Encounter (Signed)
Left pt msg advising bx result/sh

## 2020-02-15 NOTE — Telephone Encounter (Signed)
-----   Message from Brendolyn Patty, MD sent at 02/15/2020 10:31 AM EDT ----- Skin , left ankle-medial SEBORRHEIC KERATOSIS, IRRITATED  Benign

## 2020-02-18 ENCOUNTER — Ambulatory Visit: Payer: BC Managed Care – PPO | Attending: Internal Medicine

## 2020-02-18 DIAGNOSIS — Z20822 Contact with and (suspected) exposure to covid-19: Secondary | ICD-10-CM

## 2020-02-19 LAB — NOVEL CORONAVIRUS, NAA: SARS-CoV-2, NAA: NOT DETECTED

## 2020-02-19 LAB — SARS-COV-2, NAA 2 DAY TAT

## 2020-03-30 ENCOUNTER — Encounter: Payer: Self-pay | Admitting: Physician Assistant

## 2020-03-30 ENCOUNTER — Ambulatory Visit: Payer: BC Managed Care – PPO | Admitting: Physician Assistant

## 2020-03-30 ENCOUNTER — Other Ambulatory Visit: Payer: Self-pay

## 2020-03-30 ENCOUNTER — Ambulatory Visit: Payer: Self-pay | Admitting: *Deleted

## 2020-03-30 VITALS — BP 138/82 | HR 84 | Temp 98.2°F | Wt 144.6 lb

## 2020-03-30 DIAGNOSIS — T63451A Toxic effect of venom of hornets, accidental (unintentional), initial encounter: Secondary | ICD-10-CM | POA: Diagnosis not present

## 2020-03-30 MED ORDER — PREDNISONE 10 MG PO TABS: ORAL_TABLET | ORAL | 0 refills | Status: DC

## 2020-03-30 NOTE — Telephone Encounter (Signed)
Patient is calling to report she got stung at least 4 times by hornets yesterday- she is having swelling and pain/redness in area. Patient took Benadryl last night. Appointment has been scheduled for today.  Reason for Disposition . [1] Red or very tender (to touch) area AND [2] started over 24 hours after the sting  Answer Assessment - Initial Assessment Questions 1. TYPE: "What type of sting was it?" (bee, yellow jacket, etc.)      hornets 2. ONSET: "When did it occur?"      Yesterday afternoon 3. LOCATION: "Where is the sting located?"  "How many stings?"     Hands and arms- R- stung 4 times 4. SWELLING SIZE: "How big is the swelling?" (e.g., inches or cm)     Swelling in hand- puffy- can bend fingers- arm has swelling as well 5. REDNESS: "Is the area red or pink?" If Yes, ask: "What size is area of redness?" (e.g., inches or cm). "When did the redness start?"     Not red anymore- knuckles slightly red 6. PAIN: "Is there any pain?" If Yes, ask: "How bad is it?"  (Scale 1-10; or mild, moderate, severe)     Hot and painful 7. ITCHING: "Is there any itching?" If Yes, ask: "How bad is it?"      Itching present- trying not to scratch 8. RESPIRATORY DISTRESS: "Describe your breathing."     No- patient took benadryl 9. PRIOR REACTIONS: "Have you had any severe allergic reactions to stings in the past?" if yes, ask: "What happened?"     Having increased reaction from past- last time stung it took a while to get over 10. OTHER SYMPTOMS: "Do you have any other symptoms?" (e.g., abdominal pain, face or tongue swelling, new rash elsewhere, vomiting)       no 11. PREGNANCY: "Is there any chance you are pregnant?" "When was your last menstrual period?"       Not asked  Protocols used: BEE OR YELLOW JACKET STING-A-AH

## 2020-03-30 NOTE — Progress Notes (Signed)
Established patient visit   Patient: Kristi Dominguez   DOB: Sep 16, 1965   54 y.o. Female  MRN: [id number] Visit Date: 03/30/2020  Today's healthcare provider: Trinna Post, PA-C   Chief Complaint  Patient presents with  . Insect Bite  I,Annaleise Burger M Jude Linck,acting as a scribe for Trinna Post, PA-C.,have documented all relevant documentation on the behalf of Trinna Post, PA-C,as directed by  Trinna Post, PA-C while in the presence of Trinna Post, PA-C.  Subjective    HPI  Insect Bite Patient presents today for bee sting on yesterday,9/7/2021to her right hand. Patient reports her hand is swollen, red, painful, itchy. She placed ice and taken 400 mg Ibuprofen, benadryl and allergy relief. She denies chest pain or difficulty breathing.      Medications: Outpatient Medications Prior to Visit  Medication Sig  . Clobetasol Prop Emollient Base (CLOBETASOL PROPIONATE E) 0.05 % emollient cream Apply to affected area right arm as needed for itch twice daily. Avoid face, groin, underarms.  . ondansetron (ZOFRAN) 4 MG tablet Take 1 tablet (4 mg total) by mouth every 8 (eight) hours as needed.  . SUMAtriptan (IMITREX) 100 MG tablet Take 1 tablet (100 mg total) by mouth every 2 (two) hours as needed for migraine. Do NOT take more than 200mg  in 24 hours  . venlafaxine XR (EFFEXOR-XR) 150 MG 24 hr capsule Take 1 capsule (150 mg total) by mouth daily with breakfast.  . Vitamin D, Ergocalciferol, (DRISDOL) 1.25 MG (50000 UNIT) CAPS capsule TAKE 1 CAPSULE EVERY 7 DAYS  . cetirizine (ZYRTEC) 10 MG tablet Take 10 mg by mouth daily. (Patient not taking: Reported on 03/30/2020)   No facility-administered medications prior to visit.    Review of Systems  Constitutional: Negative.   Respiratory: Negative.   Cardiovascular: Negative.   Hematological: Negative.       Objective    BP 138/82 (BP Location: Left Arm, Patient Position: Sitting, Cuff Size: Normal)   Pulse 84    Temp 98.2 F (36.8 C) (Oral)   Wt 144 lb 9.6 oz (65.6 kg)   SpO2 99%   BMI 22.65 kg/m    Physical Exam Constitutional:      Appearance: Normal appearance. She is normal weight.  Musculoskeletal:     Right hand: Swelling present. No deformity or tenderness. Normal range of motion.     Left hand: Normal.     Comments: Right hand swollen and slightly erythematous.   Skin:    General: Skin is warm and dry.  Neurological:     General: No focal deficit present.     Mental Status: She is alert and oriented to person, place, and time.  Psychiatric:        Mood and Affect: Mood normal.        Behavior: Behavior normal.       No results found for any visits on 03/30/20.  Assessment & Plan    1. Hornet sting, accidental or unintentional, initial encounter  Large localized reaction from wasp sting.   - predniSONE (DELTASONE) 10 MG tablet; Take 6 pills on day 1, 5 pills on day 2 and so on until complete.  Dispense: 21 tablet; Refill: 0    Return if symptoms worsen or fail to improve.      ITrinna Post, PA-C, have reviewed all documentation for this visit. The documentation on 03/30/20 for the exam, diagnosis, procedures, and orders are all accurate and complete.  Paulene Floor  [facility] [phone] (phone) [phone] (fax)  [place]

## 2020-03-30 NOTE — Patient Instructions (Signed)
Bee, Wasp, or [employer], Adult Bees, wasps, and hornets are part of a family of insects that can sting people. These stings can cause pain and inflammation, but they are usually not serious. However, some people may have an allergic reaction to a sting. This can cause the symptoms to be more severe. What increases the risk? You may be at a greater risk of getting stung if you:  Provoke a stinging insect by swatting or disturbing it.  Wear strong-smelling soaps, deodorants, or body sprays.  Spend time outdoors near gardens with flowers or fruit trees or in clothes that expose skin.  Eat or drink outside. What are the signs or symptoms? Common symptoms of this condition include:  A red lump in the skin that sometimes has a tiny hole in the center. In some cases, a stinger may be in the center of the wound.  Pain and itching at the sting site.  Redness and swelling around the sting site. If you have an allergic reaction (localized allergic reaction), the swelling and redness may spread out from the sting site. In some cases, this reaction can continue to develop over the next 24-48 hours. In rare cases, a person may have a severe allergic reaction (anaphylactic reaction) to a sting. Symptoms of an anaphylactic reaction may include:  Wheezing or difficulty breathing.  Raised, itchy, red patches on the skin (hives).  Nausea or vomiting.  Abdominal cramping.  Diarrhea.  Tightness in the chest or chest pain.  Dizziness or fainting.  Redness of the face (flushing).  Hoarse voice.  Swollen tongue, lips, or face. How is this diagnosed? This condition is usually diagnosed based on your symptoms and medical history as well as a physical exam. You may have an allergy test to determine if you are allergic to the substance that the insect injected during the sting (venom). How is this treated? If you were stung by a bee, the stinger and a small sac of venom may be in the wound. It is  important to remove the stinger as soon as possible. You can do this by brushing across the wound with gauze, a fingernail, or a flat card such as a credit card. Removing the stinger can help reduce the severity of your body's reaction to the sting. Most stings can be treated with:  Icing to reduce swelling in the area.  Medicines (antihistamines) to treat itching or an allergic reaction.  Medicines to help reduce pain. These may be medicines that you take by mouth, or medicated creams or lotions that you apply to your skin. Pay close attention to your symptoms after you have been stung. If possible, have someone stay with you to make sure you do not have an allergic reaction. If you have any signs of an allergic reaction, call your health care provider. If you have ever had a severe allergic reaction, your health care provider may give you an inhaler or injectable medicine (epinephrine auto-injector) to use if necessary. Follow these instructions at home:   Wash the sting site 2-3 times each day with soap and water as told by your health care provider.  Apply or take over-the-counter and prescription medicines only as told by your health care provider.  If directed, apply ice to the sting area. ? Put ice in a plastic bag. ? Place a towel between your skin and the bag. ? Leave the ice on for 20 minutes, 2-3 times a day.  Do not scratch the sting area.  If  you had a severe allergic reaction to a sting, you may need: ? To wear a medical bracelet or necklace that lists the allergy. ? To learn when and how to use an anaphylaxis kit or epinephrine injection. Your family members and coworkers may also need to learn this. ? To carry an anaphylaxis kit or epinephrine injection with you at all times. How is this prevented?  Avoid swatting at stinging insects and disturbing insect nests.  Do not use fragrant soaps or lotions.  Wear shoes, pants, and long sleeves when spending time outdoors,  especially in grassy areas where stinging insects are common.  Keep outdoor areas free from nests or hives.  Keep food and drink containers covered when eating outdoors.  Avoid working or sitting near flowering plants, if possible.  Wear gloves if you are gardening or working outdoors.  If an attack by a stinging insect or a swarm seems likely in the moment, move away from the area or find a barrier between you and the insect(s), such as a door. Contact a health care provider if:  Your symptoms do not get better in 2-3 days.  You have redness, swelling, or pain that spreads beyond the area of the sting.  You have a fever. Get help right away if: You have symptoms of a severe allergic reaction. These include:  Wheezing or difficulty breathing.  Tightness in the chest or chest pain.  Light-headedness or fainting.  Itchy, raised, red patches on the skin.  Nausea or vomiting.  Abdominal cramping.  Diarrhea.  A swollen tongue or lips, or trouble swallowing.  Dizziness or fainting. Summary  Stings from bees, wasps, and hornets can cause pain and inflammation, but they are usually not serious. However, some people may have an allergic reaction to a sting. This can cause the symptoms to be more severe.  Pay close attention to your symptoms after you have been stung. If possible, have someone stay with you to make sure you do not have an allergic reaction.  Call your health care provider if you have any signs of an allergic reaction. This information is not intended to replace advice given to you by your health care provider. Make sure you discuss any questions you have with your health care provider. Document Revised: 07/04/2017 Document Reviewed: 09/13/2016 Elsevier Patient Education  2020 [employer]  

## 2020-04-16 DIAGNOSIS — Z20822 Contact with and (suspected) exposure to covid-19: Secondary | ICD-10-CM | POA: Diagnosis not present

## 2020-04-16 DIAGNOSIS — Z03818 Encounter for observation for suspected exposure to other biological agents ruled out: Secondary | ICD-10-CM | POA: Diagnosis not present

## 2020-05-17 ENCOUNTER — Other Ambulatory Visit: Payer: Self-pay

## 2020-05-17 ENCOUNTER — Ambulatory Visit: Payer: BC Managed Care – PPO | Admitting: Dermatology

## 2020-05-17 ENCOUNTER — Encounter: Payer: Self-pay | Admitting: Dermatology

## 2020-05-17 DIAGNOSIS — D2371 Other benign neoplasm of skin of right lower limb, including hip: Secondary | ICD-10-CM

## 2020-05-17 DIAGNOSIS — D485 Neoplasm of uncertain behavior of skin: Secondary | ICD-10-CM

## 2020-05-17 DIAGNOSIS — D1800 Hemangioma unspecified site: Secondary | ICD-10-CM

## 2020-05-17 DIAGNOSIS — L280 Lichen simplex chronicus: Secondary | ICD-10-CM | POA: Diagnosis not present

## 2020-05-17 DIAGNOSIS — D225 Melanocytic nevi of trunk: Secondary | ICD-10-CM | POA: Diagnosis not present

## 2020-05-17 DIAGNOSIS — L821 Other seborrheic keratosis: Secondary | ICD-10-CM

## 2020-05-17 DIAGNOSIS — D2272 Melanocytic nevi of left lower limb, including hip: Secondary | ICD-10-CM | POA: Diagnosis not present

## 2020-05-17 DIAGNOSIS — L578 Other skin changes due to chronic exposure to nonionizing radiation: Secondary | ICD-10-CM

## 2020-05-17 DIAGNOSIS — D229 Melanocytic nevi, unspecified: Secondary | ICD-10-CM

## 2020-05-17 DIAGNOSIS — D239 Other benign neoplasm of skin, unspecified: Secondary | ICD-10-CM

## 2020-05-17 DIAGNOSIS — L814 Other melanin hyperpigmentation: Secondary | ICD-10-CM

## 2020-05-17 DIAGNOSIS — Z1283 Encounter for screening for malignant neoplasm of skin: Secondary | ICD-10-CM | POA: Diagnosis not present

## 2020-05-17 NOTE — Patient Instructions (Addendum)
Melanoma ABCDEs  Melanoma is the most dangerous type of skin cancer, and is the leading cause of death from skin disease.  You are more likely to develop melanoma if you:  Have light-colored skin, light-colored eyes, or red or blond hair  Spend a lot of time in the sun  Tan regularly, either outdoors or in a tanning bed  Have had blistering sunburns, especially during childhood  Have a close family member who has had a melanoma  Have atypical moles or large birthmarks  Early detection of melanoma is key since treatment is typically straightforward and cure rates are extremely high if we catch it early.   The first sign of melanoma is often a change in a mole or a new dark spot.  The ABCDE system is a way of remembering the signs of melanoma.  A for asymmetry:  The two halves do not match. B for border:  The edges of the growth are irregular. C for color:  A mixture of colors are present instead of an even brown color. D for diameter:  Melanomas are usually (but not always) greater than 20mm - the size of a pencil eraser. E for evolution:  The spot keeps changing in size, shape, and color.  Please check your skin once per month between visits. You can use a small mirror in front and a large mirror behind you to keep an eye on the back side or your body.   If you see any new or changing lesions before your next follow-up, please call to schedule a visit.  Please continue daily skin protection including broad spectrum sunscreen SPF 30+ to sun-exposed areas, reapplying every 2 hours as needed when you're outdoors.    Topical steroids (such as triamcinolone, fluocinolone, fluocinonide, mometasone, clobetasol, halobetasol, betamethasone, hydrocortisone) can cause thinning and lightening of the skin if they are used for too long in the same area. Your physician has selected the right strength medicine for your problem and area affected on the body. Please use your medication only as directed  by your physician to prevent side effects.  Wound Care Instructions  1. Cleanse wound gently with soap and water once a day then pat dry with clean gauze. Apply a thing coat of Petrolatum (petroleum jelly, "Vaseline") over the wound (unless you have an allergy to this). We recommend that you use a new, sterile tube of Vaseline. Do not pick or remove scabs. Do not remove the yellow or white "healing tissue" from the base of the wound.  2. Cover the wound with fresh, clean, nonstick gauze and secure with paper tape. You may use Band-Aids in place of gauze and tape if the would is small enough, but would recommend trimming much of the tape off as there is often too much. Sometimes Band-Aids can irritate the skin.  3. You should call the office for your biopsy report after 1 week if you have not already been contacted.  4. If you experience any problems, such as abnormal amounts of bleeding, swelling, significant bruising, significant pain, or evidence of infection, please call the office immediately.  5. FOR ADULT SURGERY PATIENTS: If you need something for pain relief you may take 1 extra strength Tylenol (acetaminophen) AND 2 Ibuprofen (200mg  each) together every 4 hours as needed for pain. (do not take these if you are allergic to them or if you have a reason you should not take them.) Typically, you may only need pain medication for 1 to 3 days.

## 2020-05-17 NOTE — Progress Notes (Signed)
Follow-Up Visit   Subjective  Kristi Dominguez is a 54 y.o. female who presents for the following: Annual Exam (Hx dysplastic nevi - patient has noticed lesions on the chest and thigh that she would like checked).  Spot on chest gets irritated.  She has a h/o itchy rash on elbow that got better with clobetasol, but then recently started itching again.  The following portions of the chart were reviewed this encounter and updated as appropriate:     Review of Systems:  No other skin or systemic complaints except as noted in HPI or Assessment and Plan.  Objective  Well appearing patient in no apparent distress; mood and affect are within normal limits.  A full examination was performed including scalp, head, eyes, ears, nose, lips, neck, chest, axillae, abdomen, back, buttocks, bilateral upper extremities, bilateral lower extremities, hands, feet, fingers, toes, fingernails, and toenails. All findings within normal limits unless otherwise noted below.  Objective  R elbow: Mild erythema with excoriations.  Objective  Multiple brown macules scattered on the back   L abdomen: 0.35 cm medium dark brown macule, no changes per pt  Images        Objective  R upper sternum: 0.5 cm flesh colored papule        Objective  L upper medial thigh: 0.35 cm medium dark brown macule      Objective  R lat scapula: 0.3 cm medium dark brown macule        Objective  R lat thigh, R lat knee: Firm pink/brown papulenodule with dimple sign.   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 Avoid scratching/picking. Caution atrophy with long-term use.   Topical steroids (such as triamcinolone, fluocinolone, fluocinonide, mometasone, clobetasol, halobetasol, betamethasone, hydrocortisone) can cause thinning and lightening of the skin if they are used for too  long in the same area. Your physician has selected the right strength medicine for your problem and area affected on the body. Please use your medication only as directed by your physician to prevent side effects.    Other Related Medications Clobetasol Prop Emollient Base (CLOBETASOL PROPIONATE E) 0.05 % emollient cream  Nevus L abdomen  Benign-appearing.  Observation.  Call clinic for new or changing moles.  Recommend daily use of broad spectrum spf 30+ sunscreen to sun-exposed areas.    Neoplasm of uncertain behavior of skin (3) R upper sternum  Epidermal / dermal shaving  Lesion diameter (cm):  0.5 Informed consent: discussed and consent obtained   Patient was prepped and draped in usual sterile fashion: area prepped with alcohol. Anesthesia: the lesion was anesthetized in a standard fashion   Anesthetic:  1% lidocaine w/ epinephrine 1-100,000 buffered w/ 8.4% NaHCO3 Instrument used: flexible razor blade   Hemostasis achieved with: pressure, aluminum chloride and electrodesiccation   Outcome: patient tolerated procedure well   Post-procedure details: wound care instructions given   Post-procedure details comment:  Ointment and small bandage applied Additional details:  Post tx defect 0.6 cm   Specimen 1 - Surgical pathology Differential Diagnosis: D48.5 irritated nevus r/o dysplasia Check Margins: No 0.5 cm flesh colored papule  L upper medial thigh  Epidermal / dermal shaving  Lesion diameter (cm):  0.3 Informed consent: discussed and consent obtained   Patient was prepped and draped in usual sterile fashion: area prepped with alcohol. Anesthesia: the lesion was anesthetized in a standard  fashion   Anesthetic:  1% lidocaine w/ epinephrine 1-100,000 buffered w/ 8.4% NaHCO3 Instrument used: flexible razor blade   Hemostasis achieved with: pressure, aluminum chloride and electrodesiccation   Outcome: patient tolerated procedure well   Post-procedure details: wound care  instructions given   Post-procedure details comment:  Ointment and small bandage applied Additional details:  Post tx defect 0.5 cm  Specimen 2 - Surgical pathology Differential Diagnosis: D48.5 r/o dysplastic nevus  Check Margins: No 0.35 cm medium dark brown macule  R lat scapula  Epidermal / dermal shaving  Lesion diameter (cm):  0.3 Informed consent: discussed and consent obtained   Patient was prepped and draped in usual sterile fashion: area prepped with alcohol. Anesthesia: the lesion was anesthetized in a standard fashion   Anesthetic:  1% lidocaine w/ epinephrine 1-100,000 buffered w/ 8.4% NaHCO3 Instrument used: flexible razor blade   Hemostasis achieved with: pressure, aluminum chloride and electrodesiccation   Outcome: patient tolerated procedure well   Post-procedure details: wound care instructions given   Post-procedure details comment:  Ointment and small bandage applied Additional details:  Post tx defect 0.5 cm   Specimen 3 - Surgical pathology Differential Diagnosis: D48.5 r/o dysplastic nevus  Check Margins: No 0.3 cm medium dark brown macule  Dermatofibroma R lat thigh, R lat knee  Benign, observe.     Lentigines - Scattered tan macules - Discussed due to sun exposure - Benign, observe - Call for any changes  Seborrheic Keratoses - Stuck-on, waxy, tan-brown papules and plaques  - Discussed benign etiology and prognosis. - Observe - Call for any changes  Melanocytic Nevi - Tan-brown and/or pink-flesh-colored symmetric macules and papules - Benign appearing on exam today - Observation - Call clinic for new or changing moles - Recommend daily use of broad spectrum spf 30+ sunscreen to sun-exposed areas.   Hemangiomas - Red papules - Discussed benign nature - Observe - Call for any changes  Actinic Damage - diffuse scaly erythematous macules with underlying dyspigmentation - Recommend daily broad spectrum sunscreen SPF 30+ to sun-exposed  areas, reapply every 2 hours as needed.  - Call for new or changing lesions.  Skin cancer screening performed today.  Return in about 1 year (around 05/17/2021) for TBSE.  Luther Redo, CMA, am acting as scribe for Brendolyn Patty, MD .  Documentation: I have reviewed the above documentation for accuracy and completeness, and I agree with the above.  Brendolyn Patty MD

## 2020-05-23 ENCOUNTER — Telehealth: Payer: Self-pay

## 2020-05-23 NOTE — Telephone Encounter (Signed)
Patient advised of biopsy results. Right upper sternum was a benign nevus and the left upper med thigh and right lat scapula were both Dysplastic Nevi with Moderate Atypia. Patient will keep follow-up appointment as scheduled.

## 2020-05-23 NOTE — Telephone Encounter (Signed)
-----   Message from Brendolyn Patty, MD sent at 05/23/2020  8:52 AM EDT ----- 1. Skin , right upper sternum MELANOCYTIC NEVUS, INTRADERMAL TYPE 2. Skin , left upper med thigh DYSPLASTIC JUNCTIONAL NEVUS WITH MODERATE ATYPIA, CLOSE TO MARGIN 3. Skin , right lat scapula DYSPLASTIC COMPOUND NEVUS WITH MODERATE ATYPIA, CLOSE TO MARGIN  1. Benign mole 2 & 3.  Moderately atypical moles- will observe

## 2020-07-04 ENCOUNTER — Encounter: Payer: BC Managed Care – PPO | Admitting: Dermatology

## 2020-07-07 ENCOUNTER — Other Ambulatory Visit: Payer: Self-pay | Admitting: Physician Assistant

## 2020-07-07 DIAGNOSIS — Z1231 Encounter for screening mammogram for malignant neoplasm of breast: Secondary | ICD-10-CM

## 2020-09-06 ENCOUNTER — Ambulatory Visit
Admission: RE | Admit: 2020-09-06 | Discharge: 2020-09-06 | Disposition: A | Discharge status: Home / Self Care | Payer: No Typology Code available for payment source | Source: Ambulatory Visit | Attending: Physician Assistant | Admitting: Physician Assistant

## 2020-09-06 ENCOUNTER — Other Ambulatory Visit: Payer: Self-pay

## 2020-09-06 DIAGNOSIS — Z1231 Encounter for screening mammogram for malignant neoplasm of breast: Secondary | ICD-10-CM | POA: Insufficient documentation

## 2020-09-09 ENCOUNTER — Other Ambulatory Visit: Payer: Self-pay | Admitting: Physician Assistant

## 2020-09-09 ENCOUNTER — Telehealth: Payer: Self-pay

## 2020-09-09 DIAGNOSIS — N6489 Other specified disorders of breast: Secondary | ICD-10-CM

## 2020-09-09 DIAGNOSIS — R928 Other abnormal and inconclusive findings on diagnostic imaging of breast: Secondary | ICD-10-CM

## 2020-09-09 NOTE — Telephone Encounter (Signed)
Called patient and no answer. LVMTRC, id patient calls back okay for PEC to advise.

## 2020-09-09 NOTE — Telephone Encounter (Signed)
-----   Message from Mar Daring, [place] sent at 09/09/2020 10:27 AM EST ----- There was an abnormality noted in the left breast on the screening mammogram. At this time we need further imaging to better delineate what this abnormality is. [place] Poli will be in contact with you about scheduling a diagnostic mammogram and US of the left breast. At this visit the radiologist will talk with you after imaging so you will not be waiting on results.

## 2020-09-20 ENCOUNTER — Ambulatory Visit
Admission: RE | Admit: 2020-09-20 | Discharge: 2020-09-20 | Disposition: A | Discharge status: Home / Self Care | Payer: No Typology Code available for payment source | Source: Ambulatory Visit | Attending: Physician Assistant | Admitting: Physician Assistant

## 2020-09-20 ENCOUNTER — Ambulatory Visit: Admission: RE | Admit: 2020-09-20 | Payer: No Typology Code available for payment source | Source: Ambulatory Visit

## 2020-09-20 ENCOUNTER — Other Ambulatory Visit: Payer: Self-pay

## 2020-09-20 DIAGNOSIS — R928 Other abnormal and inconclusive findings on diagnostic imaging of breast: Secondary | ICD-10-CM | POA: Insufficient documentation

## 2020-09-20 DIAGNOSIS — N6489 Other specified disorders of breast: Secondary | ICD-10-CM | POA: Insufficient documentation

## 2020-11-01 ENCOUNTER — Other Ambulatory Visit: Payer: Self-pay | Admitting: Physician Assistant

## 2020-11-01 DIAGNOSIS — F3341 Major depressive disorder, recurrent, in partial remission: Secondary | ICD-10-CM

## 2020-11-01 NOTE — Telephone Encounter (Signed)
Requested medication (s) are due for refill today: yes  Requested medication (s) are on the active medication list: yes  Last refill:  08/02/2020  Future visit scheduled:no  Notes to clinic:  overdue for follow up appointment  Message has been sent to patient to schedule office visit    Requested Prescriptions  Pending Prescriptions Disp Refills   venlafaxine XR (EFFEXOR-XR) 150 MG 24 hr capsule [Pharmacy Med Name: VENLAFAXINE HCL ER 150 MG CAP] 90 capsule 3    Sig: TAKE 1 CAPSULE BY MOUTH EVERY DAY WITH BREAKFAST      Psychiatry: Antidepressants - SNRI - desvenlafaxine & venlafaxine Failed - 11/01/2020 11:01 AM      Failed - LDL in normal range and within 360 days    LDL Chol Calc (NIH)  Date Value Ref Range Status  10/20/2019 152 (H) 0 - 99 mg/dL Final          Failed - Total Cholesterol in normal range and within 360 days    Cholesterol, Total  Date Value Ref Range Status  10/20/2019 233 (H) 100 - 199 mg/dL Final          Failed - Triglycerides in normal range and within 360 days    Triglycerides  Date Value Ref Range Status  10/20/2019 80 0 - 149 mg/dL Final          Failed - Completed PHQ-2 or PHQ-9 in the last 360 days      Failed - Valid encounter within last 6 months    Recent Outpatient Visits           7 months ago Hornet sting, accidental or unintentional, initial encounter   [employer], Adriana M, PA-C   1 year ago Recurrent major depressive disorder, in partial remission [facility])   [place], [place], [place]   1 year ago Migraine without aura and without status migrainosus, not intractable   [place], Clearnce Sorrel, [place]   2 years ago Annual physical exam   [facility] [place] Malling M, [place]   2 years ago Viral URI   [facility] [place], Adriana M, [place]                Passed - Last BP in normal range    BP Readings from Last 1 Encounters:   03/30/20 138/82

## 2020-11-18 ENCOUNTER — Other Ambulatory Visit: Payer: Self-pay | Admitting: Physician Assistant

## 2020-11-18 DIAGNOSIS — E559 Vitamin D deficiency, unspecified: Secondary | ICD-10-CM

## 2020-11-21 NOTE — Telephone Encounter (Signed)
Requested medication (s) are due for refill today: no  Requested medication (s) are on the active medication list: yes   Last refill: 08/29/2020  Future visit scheduled:no  Notes to clinic:  this refill cannot be delegated    Requested Prescriptions  Pending Prescriptions Disp Refills   Vitamin D, Ergocalciferol, (DRISDOL) 1.25 MG (50000 UNIT) CAPS capsule [Pharmacy Med Name: VITAMIN D (ERGOCALCIFEROL) 1.25 MG] 12 capsule 1    Sig: TAKE 1 CAPSULE EVERY 7 DAYS      Endocrinology:  Vitamins - Vitamin D Supplementation Failed - 11/18/2020  4:34 PM      Failed - 50,000 IU strengths are not delegated      Failed - Ca in normal range and within 360 days    Calcium  Date Value Ref Range Status  10/20/2019 9.1 8.7 - 10.2 mg/dL Final          Failed - Phosphate in normal range and within 360 days    No results found for: PHOS        Failed - Vitamin D in normal range and within 360 days    Vit D, 25-Hydroxy  Date Value Ref Range Status  10/20/2019 56.0 30.0 - 100.0 ng/mL Final    Comment:    Vitamin D deficiency has been defined by the Institute of Medicine and an Endocrine Society practice guideline as a level of serum 25-OH vitamin D less than 20 ng/mL (1,2). The Endocrine Society went on to further define vitamin D insufficiency as a level between 21 and 29 ng/mL (2). 1. IOM (Institute of Medicine). 2010. Dietary reference    intakes for calcium and D. [place]: The    [employer]. 2. Holick MF, Binkley [place], Bischoff-Ferrari HA, et al.    Evaluation, treatment, and prevention of vitamin D    deficiency: an Endocrine Society clinical practice    guideline. JCEM. 2011 Jul; 96(7):1911-30.           Passed - Valid encounter within last 12 months    Recent Outpatient Visits           7 months ago Hornet sting, accidental or unintentional, initial encounter   [place], [place], PA-C   1 year ago Recurrent major depressive  disorder, in partial remission [facility])   [place], [place], [place]   1 year ago Migraine without aura and without status migrainosus, not intractable   [facility], Clearnce Sorrel, [place]   2 years ago Annual physical exam   [facility] [place], Clearnce Sorrel, [place]   2 years ago Viral URI   [employer], [place], [place]

## 2021-01-31 ENCOUNTER — Other Ambulatory Visit: Payer: Self-pay | Admitting: Family Medicine

## 2021-01-31 DIAGNOSIS — F3341 Major depressive disorder, recurrent, in partial remission: Secondary | ICD-10-CM

## 2021-01-31 NOTE — Telephone Encounter (Signed)
Requested medication (s) are due for refill today: yes  Requested medication (s) are on the active medication list: yes   Last refill: 11/01/2020  Future visit scheduled: no  Notes to clinic:  Review for refill Several attempts made to contact patient for appt   Requested Prescriptions  Pending Prescriptions Disp Refills   venlafaxine XR (EFFEXOR-XR) 150 MG 24 hr capsule [Pharmacy Med Name: VENLAFAXINE HCL ER 150 MG CAP] 90 capsule 0    Sig: TAKE 1 CAPSULE BY MOUTH EVERY DAY WITH BREAKFAST      Psychiatry: Antidepressants - SNRI - desvenlafaxine & venlafaxine Failed - 01/31/2021 11:08 AM      Failed - LDL in normal range and within 360 days    LDL Chol Calc (NIH)  Date Value Ref Range Status  10/20/2019 152 (H) 0 - 99 mg/dL Final          Failed - Total Cholesterol in normal range and within 360 days    Cholesterol, Total  Date Value Ref Range Status  10/20/2019 233 (H) 100 - 199 mg/dL Final          Failed - Triglycerides in normal range and within 360 days    Triglycerides  Date Value Ref Range Status  10/20/2019 80 0 - 149 mg/dL Final          Failed - Completed PHQ-2 or PHQ-9 in the last 360 days      Failed - Valid encounter within last 6 months    Recent Outpatient Visits           10 months ago Hornet sting, accidental or unintentional, initial encounter   [employer], Adriana M, PA-C   1 year ago Recurrent major depressive disorder, in partial remission [facility])   [place], [place], [place]   1 year ago Migraine without aura and without status migrainosus, not intractable   [place], Clearnce Sorrel, [place]   2 years ago Annual physical exam   [facility] F[place] Malling M, [place]   2 years ago Viral URI   [facility] [place], Adriana M, [place]                Passed - Last BP in normal range    BP Readings from Last 1 Encounters:  03/30/20 138/82

## 2021-02-01 ENCOUNTER — Other Ambulatory Visit: Payer: Self-pay

## 2021-02-01 ENCOUNTER — Ambulatory Visit (INDEPENDENT_AMBULATORY_CARE_PROVIDER_SITE_OTHER): Payer: No Typology Code available for payment source | Admitting: Family Medicine

## 2021-02-01 ENCOUNTER — Encounter: Payer: Self-pay | Admitting: Family Medicine

## 2021-02-01 VITALS — BP 132/84 | HR 84 | Resp 16 | Wt 153.6 lb

## 2021-02-01 DIAGNOSIS — G43009 Migraine without aura, not intractable, without status migrainosus: Secondary | ICD-10-CM

## 2021-02-01 DIAGNOSIS — F3341 Major depressive disorder, recurrent, in partial remission: Secondary | ICD-10-CM | POA: Diagnosis not present

## 2021-02-01 MED ORDER — VENLAFAXINE HCL ER 225 MG PO TB24: 225.0000 mg | ORAL_TABLET | Freq: Every day | ORAL | 0 refills | Status: DC

## 2021-02-01 MED ORDER — SUMATRIPTAN SUCCINATE 100 MG PO TABS: 100.0000 mg | ORAL_TABLET | ORAL | 11 refills | Status: DC | PRN

## 2021-02-01 NOTE — Patient Instructions (Signed)
http://APA.org/depression-guideline"> https://clinicalkey.com"> http://point-of-care.elsevierperformancemanager.com/skills/"> http://point-of-care.elsevierperformancemanager.com">  Managing Depression, Adult Depression is a mental health condition that affects your thoughts, feelings, and actions. Being diagnosed with depression can bring you relief if you did not know why you have felt or behaved a certain way. It could also leave you feeling overwhelmed with uncertainty about your future. Preparing yourself tomanage your symptoms can help you feel more positive about your future. How to manage lifestyle changes Managing stress  Stress is your body's reaction to life changes and events, both good and bad. Stress can add to your feelings of depression. Learning to manage your stresscan help lessen your feelings of depression. Try some of the following approaches to reducing your stress (stress reduction techniques): Listen to music that you enjoy and that inspires you. Try using a meditation app or take a meditation class. Develop a practice that helps you connect with your spiritual self. Walk in nature, pray, or go to a place of worship. Do some deep breathing. To do this, inhale slowly through your nose. Pause at the top of your inhale for a few seconds and then exhale slowly, letting your muscles relax. Practice yoga to help relax and work your muscles. Choose a stress reduction technique that suits your lifestyle and personality. These techniques take time and practice to develop. Set aside 5-15 minutes a day to do them. Therapists can offer training in these techniques. Other things you can do to manage stress include: Keeping a stress diary. Knowing your limits and saying no when you think something is too much. Paying attention to how you react to certain situations. You may not be able to control everything, but you can change your reaction. Adding humor to your life by watching funny films  or TV shows. Making time for activities that you enjoy and that relax you.  Medicines Medicines, such as antidepressants, are often a part of treatment for depression. Talk with your pharmacist or health care provider about all the medicines, supplements, and herbal products that you take, their possible side effects, and what medicines and other products are safe to take together. Make sure to report any side effects you may have to your health care provider. Relationships Your health care provider may suggest family therapy, couples therapy, orindividual therapy as part of your treatment. How to recognize changes Everyone responds differently to treatment for depression. As you recover from depression, you may start to: Have more interest in doing activities. Feel less hopeless. Have more energy. Overeat less often, or have a better appetite. Have better mental focus. It is important to recognize if your depression is not getting better or is getting worse. The symptoms you had in the beginning may return, such as: Tiredness (fatigue) or low energy. Eating too much or too little. Sleeping too much or too little. Feeling restless, agitated, or hopeless. Trouble focusing or making decisions. Unexplained physical complaints. Feeling irritable, angry, or aggressive. If you or your family members notice these symptoms coming back, let yourhealth care provider know right away. Follow these instructions at home: Activity  Try to get some form of exercise each day, such as walking, biking, swimming, or lifting weights. Practice stress reduction techniques. Engage your mind by taking a class or doing some volunteer work.  Lifestyle Get the right amount and quality of sleep. Cut down on using caffeine, tobacco, alcohol, and other potentially harmful substances. Eat a healthy diet that includes plenty of vegetables, fruits, whole grains, low-fat dairy products, and lean protein. Do not   eat  a lot of foods that are high in solid fats, added sugars, or salt (sodium). General instructions Take over-the-counter and prescription medicines only as told by your health care provider. Keep all follow-up visits as told by your health care provider. This is important. Where to find support Talking to others  Friends and family members can be sources of support and guidance. Talk to trusted friends or family members about your condition. Explain your symptoms to them, and let them know that you are working with a health care provider to treat your depression. Tell friends and family members how they also can behelpful. Finances Find appropriate mental health providers that fit with your financial situation. Talk with your health care provider about options to get reduced prices on your medicines. Where to find more information You can find support in your area from: Anxiety and Depression Association of America (ADAA): www.adaa.org Mental Health America: www.mentalhealthamerica.net National Alliance on Mental Illness: www.nami.org Contact a health care provider if: You stop taking your antidepressant medicines, and you have any of these symptoms: Nausea. Headache. Light-headedness. Chills and body aches. Not being able to sleep (insomnia). You or your friends and family think your depression is getting worse. Get help right away if: You have thoughts of hurting yourself or others. If you ever feel like you may hurt yourself or others, or have thoughts about taking your own life, get help right away. Go to your nearest emergency department or: Call your local emergency services (911 in the [place] Call a suicide crisis helpline, such as the National Suicide Prevention Lifeline at 1-800-273-8255. This is open 24 hours a day in the [place] Text the Crisis Text Line at 741741 (in the [place] Summary If you are diagnosed with depression, preparing yourself to manage your symptoms is a good way  to feel positive about your future. Work with your health care provider on a management plan that includes stress reduction techniques, medicines (if applicable), therapy, and healthy lifestyle habits. Keep talking with your health care provider about how your treatment is working. If you have thoughts about taking your own life, call a suicide crisis helpline or text a crisis text line. This information is not intended to replace advice given to you by your health care provider. Make sure you discuss any questions you have with your healthcare provider. Document Revised: 05/20/2019 Document Reviewed: 05/20/2019 Elsevier Patient Education  2022 Elsevier Inc.  

## 2021-02-01 NOTE — Progress Notes (Signed)
Established patient visit   Patient: Kristi Dominguez   DOB: 1965-11-03   55 y.o. Female  MRN: [id number] Visit Date: 02/01/2021  Today's healthcare provider: Wilhemena Durie, MD   Chief Complaint  Patient presents with   Depression   Subjective    HPI  Patient comes in today for follow-up of depression.  She is mourning the loss of her father who died in 12/12/22 and her mother has progressive dementia.  There is a lot of family conflict as a as  family is arguing over the estate of her father. PHQ-9 is a 7 today after being at 3 to 4 months ago. Depression, Follow-up  She  was last seen for this 10/12/2019. Changes made at last visit include; Stable. Continue Venlafaxine XR 150mg .   She reports good compliance with treatment. She is not having side effects.   She reports good tolerance of treatment. Current symptoms include: depressed mood, difficulty concentrating, insomnia, and weight gain She feels she is Unchanged since last visit. Patient states that her father recently passed away and daughter is moving to college in fall and states that symptoms of depression has been getting worse. Patient also reports that she has noticed more frequent headaches and unsure if related to stress/depression.   Depression screen [facility] 2/9 02/01/2021 10/12/2019 07/09/2018  Decreased Interest 1 0 0  Down, Depressed, Hopeless 2 0 1  PHQ - 2 Score 3 0 1  Altered sleeping 1 0 1  Tired, decreased energy 1 1 1   Change in appetite 1 0 1  Feeling bad or failure about yourself  0 0 1  Trouble concentrating 1 1 1   Moving slowly or fidgety/restless 0 1 0  Suicidal thoughts 0 0 0  PHQ-9 Score 7 3 6   Difficult doing work/chores Very difficult Not difficult at all -    -----------------------------------------------------------------------------------------   Patient Active Problem List   Diagnosis Date Noted   Hypercholesterolemia 05/18/2015   Dermatitis, eczematoid 04/06/2014   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 (Patient not taking: No sig reported)   Clobetasol Prop Emollient Base (CLOBETASOL PROPIONATE E) 0.05 % emollient cream Apply to affected area right arm as needed for itch twice daily. Avoid face, groin, underarms.   ondansetron (ZOFRAN) 4 MG tablet Take 1 tablet (4 mg total) by mouth every 8 (eight) hours as needed.   SUMAtriptan (IMITREX) 100 MG tablet Take 1 tablet (100 mg total) by mouth every 2 (two) hours as needed for migraine. Do NOT take more than 200mg  in 24 hours   venlafaxine XR (EFFEXOR-XR) 150 MG 24 hr capsule TAKE 1 CAPSULE BY MOUTH EVERY DAY WITH BREAKFAST   [DISCONTINUED] predniSONE (DELTASONE) 10 MG tablet Take 6 pills on day 1, 5 pills on day 2 and so on until complete.   No facility-administered medications prior to visit.    Review of  Systems  Constitutional:  Negative for appetite change, chills, fatigue and fever.  Respiratory:  Negative for chest tightness and shortness of breath.   Cardiovascular:  Negative for chest pain and palpitations.  Gastrointestinal:  Negative for abdominal pain, nausea and vomiting.  Neurological:  Negative for dizziness and weakness.       Objective    BP 132/84   Pulse 84   Resp 16   Wt 153 lb 9.6 oz (69.7 kg)   SpO2 99%   BMI 24.06 kg/m      Physical Exam Vitals reviewed.  Constitutional:      General: She is not in acute distress.    Appearance: Normal appearance. She is  well-developed. She is not ill-appearing or diaphoretic.  Cardiovascular:     Rate and Rhythm: Normal rate and regular rhythm.     Pulses: Normal pulses.     Heart sounds: Normal heart sounds. No murmur heard.   No friction rub. No gallop.  Pulmonary:     Effort: Pulmonary effort is normal. No respiratory distress.     Breath sounds: Normal breath sounds. No wheezing or rales.  Musculoskeletal:     Cervical back: Normal range of motion and neck supple.  Neurological:     General: No focal deficit present.     Mental Status: She is alert and oriented to person, place, and time.  Psychiatric:        Mood and Affect: Mood normal.        Behavior: Behavior normal.        Thought Content: Thought content normal.        Judgment: Judgment normal.     Comments: She is tearful at times during the interview.      No results found for any visits on 02/01/21.  Assessment & Plan     1. Recurrent major depressive disorder, in partial remission (HCC) Morning the loss of her father and the stress of her mother with dementia and family problems.  May need counseling.  Increase Effexor from 150 to 20-25 daily and follow-up in 2 months. More than 50% 25 minute visit spent in counseling or coordination of care  2. Migraine without aura and without status migrainosus, not intractable Refill Imitrex - SUMAtriptan (IMITREX) 100 MG tablet; Take 1 tablet (100 mg total) by mouth every 2 (two) hours as needed for migraine. Do NOT take more than 200mg  in 24 hours  Dispense: 10 tablet; Refill: 11   No follow-ups on file.      I, Wilhemena Durie, MD, have reviewed all documentation for this visit. The documentation on 02/05/21 for the exam, diagnosis, procedures, and orders are all accurate and complete.    Oron Westrup Cranford Mon, MD  [facility] [phone] (phone) [phone] (fax)  [place]

## 2021-04-04 ENCOUNTER — Ambulatory Visit: Payer: No Typology Code available for payment source | Admitting: Family Medicine

## 2021-04-04 ENCOUNTER — Encounter: Payer: Self-pay | Admitting: Family Medicine

## 2021-04-04 ENCOUNTER — Other Ambulatory Visit: Payer: Self-pay

## 2021-04-04 VITALS — BP 149/86 | HR 61 | Temp 98.6°F | Resp 16 | Ht 67.0 in | Wt 159.0 lb

## 2021-04-04 DIAGNOSIS — F3341 Major depressive disorder, recurrent, in partial remission: Secondary | ICD-10-CM

## 2021-04-04 NOTE — Progress Notes (Signed)
Established patient visit   Patient: Kristi Dominguez   DOB: May 27, 1966   55 y.o. Female  MRN: [id number] Visit Date: 04/04/2021  Today's healthcare provider: Wilhemena Durie, MD   Chief Complaint  Patient presents with   Depression   Subjective    HPI  Patient comes in today for follow-up.  He remains under a lot of stress but is doing well.  She states she almost canceled this appointment. She has 3 children, her daughter is at [employer].  She has a teenage son with significant autism and a son named Hetty Blend who is in eighth grade. Depression, Follow-up  She  was last seen for this 2 months ago. Changes made at last visit include increasing Effexor to '225mg'$ . Patient reports that she never increased the medication. She has been managing her  symptoms on the old dose.    She reports good compliance with treatment. She is not having side effects.   She reports good tolerance of treatment.  She feels she is Unchanged since last visit.  Depression screen [facility] 2/9 02/01/2021 10/12/2019 07/09/2018  Decreased Interest 1 0 0  Down, Depressed, Hopeless 2 0 1  PHQ - 2 Score 3 0 1  Altered sleeping 1 0 1  Tired, decreased energy '1 1 1  '$ Change in appetite 1 0 1  Feeling bad or failure about yourself  0 0 1  Trouble concentrating '1 1 1  '$ Moving slowly or fidgety/restless 0 1 0  Suicidal thoughts 0 0 0  PHQ-9 Score '7 3 6  '$ Difficult doing work/chores Very difficult Not difficult at all -      Medications: Outpatient Medications Prior to Visit  Medication Sig   Clobetasol Prop Emollient Base (CLOBETASOL PROPIONATE E) 0.05 % emollient cream Apply to affected area right arm as needed for itch twice daily. Avoid face, groin, underarms.   ondansetron (ZOFRAN) 4 MG tablet Take 1 tablet (4 mg total) by mouth every 8 (eight) hours as needed.   SUMAtriptan (IMITREX) 100 MG tablet Take 1 tablet (100 mg total) by mouth every 2 (two) hours as needed for migraine. Do NOT take more than '200mg'$   in 24 hours   Venlafaxine HCl 225 MG TB24 Take 1 tablet (225 mg total) by mouth daily.   Vitamin D, Ergocalciferol, (DRISDOL) 1.25 MG (50000 UNIT) CAPS capsule TAKE 1 CAPSULE EVERY 7 DAYS   cetirizine (ZYRTEC) 10 MG tablet Take 10 mg by mouth daily. (Patient not taking: Reported on 04/04/2021)   No facility-administered medications prior to visit.    Review of Systems  Constitutional:  Negative for activity change and fatigue.  Respiratory:  Negative for cough and shortness of breath.   Cardiovascular:  Negative for chest pain, palpitations and leg swelling.  Musculoskeletal:  Negative for arthralgias and myalgias.  Psychiatric/Behavioral:  Negative for agitation, self-injury, sleep disturbance and suicidal ideas. The patient is not nervous/anxious.        Objective    BP (!) 149/86   Pulse 61   Temp 98.6 F (37 C)   Resp 16   Ht '5\' 7"'$  (1.702 m)   Wt 159 lb (72.1 kg)   BMI 24.90 kg/m  BP Readings from Last 3 Encounters:  04/04/21 (!) 149/86  02/01/21 132/84  03/30/20 138/82   Wt Readings from Last 3 Encounters:  04/04/21 159 lb (72.1 kg)  02/01/21 153 lb 9.6 oz (69.7 kg)  03/30/20 144 lb 9.6 oz (65.6 kg)  Physical Exam Vitals reviewed.  Constitutional:      General: She is not in acute distress.    Appearance: Normal appearance. She is well-developed. She is not ill-appearing or diaphoretic.  Cardiovascular:     Rate and Rhythm: Normal rate and regular rhythm.     Pulses: Normal pulses.     Heart sounds: Normal heart sounds. No murmur heard.   No friction rub. No gallop.  Pulmonary:     Effort: Pulmonary effort is normal. No respiratory distress.     Breath sounds: Normal breath sounds. No wheezing or rales.  Musculoskeletal:     Cervical back: Normal range of motion and neck supple.  Neurological:     General: No focal deficit present.     Mental Status: She is alert and oriented to person, place, and time.  Psychiatric:        Mood and Affect: Mood  normal.        Behavior: Behavior normal.        Thought Content: Thought content normal.        Judgment: Judgment normal.      No results found for any visits on 04/04/21.  Assessment & Plan     1. Recurrent major depressive disorder, in partial remission ([place]) This might be an adjustment reaction versus recurrent depression.  Continue venlafaxine but recommend counseling.  I referred her to [facility] counseling   No follow-ups on file.      I, Wilhemena Durie, MD, have reviewed all documentation for this visit. The documentation on 04/09/21 for the exam, diagnosis, procedures, and orders are all accurate and complete.    Jahking Lesser Cranford Mon, MD  [facility] [phone] (phone) [phone] (fax)  [place]

## 2021-05-05 ENCOUNTER — Other Ambulatory Visit: Payer: Self-pay | Admitting: Family Medicine

## 2021-05-05 NOTE — Telephone Encounter (Signed)
Requested medications are due for refill today NO  Requested medications are on the active medication list Dose inconsistent  Last visit 04/04/21  Future visit scheduled 05/18/21  Notes to clinic Dose inconsistent with current med list, labs out of date.

## 2021-05-08 ENCOUNTER — Telehealth: Payer: Self-pay | Admitting: Family Medicine

## 2021-05-08 NOTE — Telephone Encounter (Signed)
Copied from [place] [id number]. Topic: Quick Communication - Rx Refill/Question >> May 08, 2021  2:47 PM Leward Quan A wrote: Medication: venlafaxine XR (EFFEXOR-XR) 150 MG 24 hr capsule  Asking for this dose since higher one is way too expensive  Has the patient contacted their pharmacy? Yes.  Too expensive for higher dose  (Agent: If no, request that the patient contact the pharmacy for the refill.) (Agent: If yes, when and what did the pharmacy advise?)  Preferred Pharmacy (with phone number or street name): [place], [place] - [address]  Phone:  [phone] Fax:  [phone]    Has the patient been seen for an appointment in the last year OR does the patient have an upcoming appointment? Yes.    Agent: Please be advised that RX refills may take up to 3 business days. We ask that you follow-up with your pharmacy.

## 2021-05-09 ENCOUNTER — Other Ambulatory Visit: Payer: Self-pay

## 2021-05-09 MED ORDER — VENLAFAXINE HCL ER 150 MG PO CP24: 150.0000 mg | ORAL_CAPSULE | Freq: Every day | ORAL | 0 refills | Status: DC

## 2021-05-09 NOTE — Telephone Encounter (Signed)
Patient was seen by Dr. Rosanna Randy on 04/04/21 for recurrent depression in his note he stated for patient to continue Venlafaxine. KW

## 2021-05-09 NOTE — Telephone Encounter (Signed)
Patient advised as below. She reports she has never taken the 225mg  and would like to continue taking venlafaxine xr 150mg  daily, not 1.5 tablet. Will send in new prescription.

## 2021-05-15 ENCOUNTER — Other Ambulatory Visit: Payer: Self-pay

## 2021-05-15 ENCOUNTER — Ambulatory Visit: Payer: No Typology Code available for payment source | Admitting: Dermatology

## 2021-05-15 DIAGNOSIS — L578 Other skin changes due to chronic exposure to nonionizing radiation: Secondary | ICD-10-CM

## 2021-05-15 DIAGNOSIS — Z1283 Encounter for screening for malignant neoplasm of skin: Secondary | ICD-10-CM | POA: Diagnosis not present

## 2021-05-15 DIAGNOSIS — L720 Epidermal cyst: Secondary | ICD-10-CM

## 2021-05-15 DIAGNOSIS — L280 Lichen simplex chronicus: Secondary | ICD-10-CM

## 2021-05-15 DIAGNOSIS — Z86018 Personal history of other benign neoplasm: Secondary | ICD-10-CM

## 2021-05-15 DIAGNOSIS — L209 Atopic dermatitis, unspecified: Secondary | ICD-10-CM

## 2021-05-15 DIAGNOSIS — D1801 Hemangioma of skin and subcutaneous tissue: Secondary | ICD-10-CM

## 2021-05-15 DIAGNOSIS — L821 Other seborrheic keratosis: Secondary | ICD-10-CM

## 2021-05-15 DIAGNOSIS — D229 Melanocytic nevi, unspecified: Secondary | ICD-10-CM

## 2021-05-15 DIAGNOSIS — L700 Acne vulgaris: Secondary | ICD-10-CM

## 2021-05-15 DIAGNOSIS — L814 Other melanin hyperpigmentation: Secondary | ICD-10-CM

## 2021-05-15 MED ORDER — CLOBETASOL PROPIONATE 0.05 % EX CREA: TOPICAL_CREAM | CUTANEOUS | 0 refills | Status: DC

## 2021-05-15 MED ORDER — OPZELURA 1.5 % EX CREA: 1.0000 "application " | TOPICAL_CREAM | Freq: Two times a day (BID) | CUTANEOUS | 1 refills | Status: DC

## 2021-05-15 NOTE — Progress Notes (Signed)
Follow-Up Visit   Subjective  Kristi Dominguez is a 55 y.o. female who presents for the following: Annual Exam.  Patient here for TBSE. She has a history of multiple dysplastic nevi. She has a cyst of the right medial breast that she would like to have scheduled for removal. She has an itchy spot on the right elbow that is improved with clobetasol cream. She has a family history of allergies in both children.  The following portions of the chart were reviewed this encounter and updated as appropriate:       Review of Systems:  No other skin or systemic complaints except as noted in HPI or Assessment and Plan.  Objective  Well appearing patient in no apparent distress; mood and affect are within normal limits.  A full examination was performed including scalp, head, eyes, ears, nose, lips, neck, chest, axillae, abdomen, back, buttocks, bilateral upper extremities, bilateral lower extremities, hands, feet, fingers, toes, fingernails, and toenails. All findings within normal limits unless otherwise noted below.  R lateral elbow Pink patch with Mild lichenification. Gets itchy places off and on constantly on arms  Right Medial Breast 1.3cm firm sq nodule  face Inflammatory papule on left jaw.   Assessment & Plan  Skin cancer screening performed today.  Actinic Damage - chronic, secondary to cumulative UV radiation exposure/sun exposure over time - diffuse scaly erythematous macules with underlying dyspigmentation - Recommend daily broad spectrum sunscreen SPF 30+ to sun-exposed areas, reapply every 2 hours as needed.  - Recommend staying in the shade or wearing long sleeves, sun glasses (UVA+UVB protection) and wide brim hats (4-inch brim around the entire circumference of the hat). - Call for new or changing lesions.  History of Dysplastic Nevi - No evidence of recurrence today - Recommend regular full body skin exams - Recommend daily broad spectrum sunscreen SPF 30+ to  sun-exposed areas, reapply every 2 hours as needed.  - Call if any new or changing lesions are noted between office visits  Lentigines - Scattered tan macules. - Due to sun exposure - Benign-appering, observe - Recommend daily broad spectrum sunscreen SPF 30+ to sun-exposed areas, reapply every 2 hours as needed. - Call for any changes  Seborrheic Keratoses - Stuck-on, waxy, tan-brown papules and/or plaques  - Benign-appearing - Discussed benign etiology and prognosis. - Observe - Call for any changes  Melanocytic Nevi - Tan-brown and/or pink-flesh-colored symmetric macules and papules - Benign appearing on exam today - Observation - Call clinic for new or changing moles - Recommend daily use of broad spectrum spf 30+ sunscreen to sun-exposed areas.   Hemangiomas - Red papules, including left post thigh - Discussed benign nature - Observe - Call for any changes  Lichen simplex chronicus  Related Medications Clobetasol Prop Emollient Base (CLOBETASOL PROPIONATE E) 0.05 % emollient cream Apply to affected area right arm as needed for itch twice daily. Avoid face, groin, underarms.  Atopic dermatitis, unspecified type R lateral elbow  With flare Atopic dermatitis (eczema) is a chronic, relapsing, pruritic condition that can significantly affect quality of life. It is often associated with allergic rhinitis and/or asthma and can require treatment with topical medications, phototherapy, or in severe cases biologic injectable medication (Dupixent; Adbry) or Oral JAK inhibitors.  Avoid scratching.  Continue clobetasol cream qd/bid prn itch. Caution atrophy with long-term use. Avoid face, groin, axilla.  Start Opzelura Cream Apply qd/bid AA prn dsp 60g 1Rf.  Topical steroids (such as triamcinolone, fluocinolone, fluocinonide, mometasone, clobetasol, halobetasol, betamethasone,  hydrocortisone) can cause thinning and lightening of the skin if they are used for too long in the  same area. Your physician has selected the right strength medicine for your problem and area affected on the body. Please use your medication only as directed by your physician to prevent side effects.    Ruxolitinib Phosphate (OPZELURA) 1.5 % CREA - R lateral elbow Apply 1 application topically in the morning and at bedtime.  clobetasol cream (TEMOVATE) 0.05 % - R lateral elbow Apply topically to itchy rash on right elbow until improved. Avoid face, groin, axilla.  Epidermal inclusion cyst Right Medial Breast  Benign-appearing. Exam most consistent with an epidermal inclusion cyst. Discussed that a cyst is a benign growth that can grow over time and sometimes get irritated or inflamed. Recommend observation if it is not bothersome. Discussed option of surgical excision to remove it if it is growing, symptomatic, or other changes noted. Patient states it has grown and will schedule for excision. Pre op form given.   Acne vulgaris face  Discussed Winlevi, Dapsone, tretinoin. Patient defers today. She will continue to spot treat with her child's acne prescription.   Return for cyst excison of the right medial breast, 1 year TBSE.  IJamesetta Orleans, CMA, am acting as scribe for Brendolyn Patty, MD .  Documentation: I have reviewed the above documentation for accuracy and completeness, and I agree with the above.  Brendolyn Patty MD

## 2021-05-15 NOTE — Patient Instructions (Addendum)
Pre-Operative Instructions  You are scheduled for a surgical procedure at [facility]. We recommend you read the following instructions. If you have any questions or concerns, please call the office at [phone].  Shower and wash the entire body with soap and water the day of your surgery paying special attention to cleansing at and around the planned surgery site.  Avoid aspirin or aspirin containing products at least fourteen (14) days prior to your surgical procedure and for at least one week (7 Days) after your surgical procedure. If you take aspirin on a regular basis for heart disease or history of stroke or for any other reason, we may recommend you continue taking aspirin but please notify us if you take this on a regular basis. Aspirin can cause more bleeding to occur during surgery as well as prolonged bleeding and bruising after surgery.   Avoid other nonsteroidal pain medications at least one week prior to surgery and at least one week prior to your surgery. These include medications such as Ibuprofen (Motrin, Advil and Nuprin), Naprosyn, Voltaren, Relafen, etc. If medications are used for therapeutic reasons, please inform us as they can cause increased bleeding or prolonged bleeding during and bruising after surgical procedures.   Please advise [place] if you are taking any "blood thinner" medications such as Coumadin or Dipyridamole or Plavix or similar medications. These cause increased bleeding and prolonged bleeding during procedures and bruising after surgical procedures. We may have to consider discontinuing these medications briefly prior to and shortly after your surgery if safe to do so.   Please inform us of all medications you are currently taking. All medications that are taken regularly should be taken the day of surgery as you always do. Nevertheless, we need to be informed of what medications you are taking prior to surgery to know whether they will affect the  procedure or cause any complications.   Please inform us of any medication allergies. Also inform us of whether you have allergies to Latex or rubber products or whether you have had any adverse reaction to Lidocaine or Epinephrine.  Please inform us of any prosthetic or artificial body parts such as artificial heart valve, joint replacements, etc., or similar condition that might require preoperative antibiotics.   We recommend avoidance of alcohol at least two weeks prior to surgery and continued avoidance for at least two weeks after surgery.   We recommend discontinuation of tobacco smoking at least two weeks prior to surgery and continued abstinence for at least two weeks after surgery.  Do not plan strenuous exercise, strenuous work or strenuous lifting for approximately four weeks after your surgery.   We request if you are unable to make your scheduled surgical appointment, please call us at least a week in advance or as soon as you are aware of a problem so that we can cancel or reschedule the appointment.   You MAY TAKE TYLENOL (acetaminophen) for pain as it is not a blood thinner.   PLEASE PLAN TO BE IN TOWN FOR TWO WEEKS FOLLOWING SURGERY, THIS IS IMPORTANT SO YOU CAN BE CHECKED FOR DRESSING CHANGES, SUTURE REMOVAL AND TO MONITOR FOR POSSIBLE COMPLICATIONS.    Melanoma ABCDEs  Melanoma is the most dangerous type of skin cancer, and is the leading cause of death from skin disease.  You are more likely to develop melanoma if you: Have light-colored skin, light-colored eyes, or red or blond hair Spend a lot of time in the sun Tan regularly, either  outdoors or in a tanning bed Have had blistering sunburns, especially during childhood Have a close family member who has had a melanoma Have atypical moles or large birthmarks  Early detection of melanoma is key since treatment is typically straightforward and cure rates are extremely high if we catch it early.   The first sign of  melanoma is often a change in a mole or a new dark spot.  The ABCDE system is a way of remembering the signs of melanoma.  A for asymmetry:  The two halves do not match. B for border:  The edges of the growth are irregular. C for color:  A mixture of colors are present instead of an even brown color. D for diameter:  Melanomas are usually (but not always) greater than 61mm - the size of a pencil eraser. E for evolution:  The spot keeps changing in size, shape, and color.  Please check your skin once per month between visits. You can use a small mirror in front and a large mirror behind you to keep an eye on the back side or your body.   If you see any new or changing lesions before your next follow-up, please call to schedule a visit.  Please continue daily skin protection including broad spectrum sunscreen SPF 30+ to sun-exposed areas, reapplying every 2 hours as needed when you're outdoors.   Staying in the shade or wearing long sleeves, sun glasses (UVA+UVB protection) and wide brim hats (4-inch brim around the entire circumference of the hat) are also recommended for sun protection.    Topical steroids (such as triamcinolone, fluocinolone, fluocinonide, mometasone, clobetasol, halobetasol, betamethasone, hydrocortisone) can cause thinning and lightening of the skin if they are used for too long in the same area. Your physician has selected the right strength medicine for your problem and area affected on the body. Please use your medication only as directed by your physician to prevent side effects.    If you have any questions or concerns for your doctor, please call our main line at [phone] and press option 4 to reach your doctor's medical assistant. If no one answers, please leave a voicemail as directed and we will return your call as soon as possible. Messages left after 4 pm will be answered the following business day.   You may also send [place] a message via [place]. We typically  respond to MyChart messages within 1-2 business days.  For prescription refills, please ask your pharmacy to contact our office. Our fax number is [phone].  If you have an urgent issue when the clinic is closed that cannot wait until the next business day, you can page your doctor at the number below.    Please note that while we do our best to be available for urgent issues outside of office hours, we are not available 24/7.   If you have an urgent issue and are unable to reach [place], you may choose to seek medical care at your doctor's office, retail clinic, urgent care center, or emergency room.  If you have a medical emergency, please immediately call 911 or go to the emergency department.  Pager Numbers  - Dr. Nehemiah Massed: [phone]  - Dr. Laurence Ferrari: [phone]  - Dr. Nicole Kindred: [phone]  In the event of inclement weather, please call our main line at [phone] for an update on the status of any delays or closures.  Dermatology Medication Tips: Please keep the boxes that topical medications come in in order to help keep  track of the instructions about where and how to use these. Pharmacies typically print the medication instructions only on the boxes and not directly on the medication tubes.   If your medication is too expensive, please contact our office at [phone] option 4 or send [place] a message through [place].   We are unable to tell what your co-pay for medications will be in advance as this is different depending on your insurance coverage. However, we may be able to find a substitute medication at lower cost or fill out paperwork to get insurance to cover a needed medication.   If a prior authorization is required to get your medication covered by your insurance company, please allow [place] 1-2 business days to complete this process.  Drug prices often vary depending on where the prescription is filled and some pharmacies may offer cheaper prices.  The website  www.goodrx.com contains coupons for medications through different pharmacies. The prices here do not account for what the cost may be with help from insurance (it may be cheaper with your insurance), but the website can give you the price if you did not use any insurance.  - You can print the associated coupon and take it with your prescription to the pharmacy.  - You may also stop by our office during regular business hours and pick up a GoodRx coupon card.  - If you need your prescription sent electronically to a different pharmacy, notify our office through [facility] or by phone at [phone] option 4.

## 2021-05-18 ENCOUNTER — Encounter: Payer: Self-pay | Admitting: Family Medicine

## 2021-05-18 ENCOUNTER — Other Ambulatory Visit: Payer: Self-pay

## 2021-05-18 ENCOUNTER — Other Ambulatory Visit: Payer: Self-pay | Admitting: Family Medicine

## 2021-05-18 ENCOUNTER — Ambulatory Visit (INDEPENDENT_AMBULATORY_CARE_PROVIDER_SITE_OTHER): Payer: No Typology Code available for payment source | Admitting: Family Medicine

## 2021-05-18 VITALS — BP 157/97 | HR 85 | Temp 97.0°F | Resp 16 | Ht 67.0 in | Wt 159.0 lb

## 2021-05-18 DIAGNOSIS — E7800 Pure hypercholesterolemia, unspecified: Secondary | ICD-10-CM | POA: Insufficient documentation

## 2021-05-18 DIAGNOSIS — Z0000 Encounter for general adult medical examination without abnormal findings: Secondary | ICD-10-CM | POA: Diagnosis not present

## 2021-05-18 DIAGNOSIS — G43009 Migraine without aura, not intractable, without status migrainosus: Secondary | ICD-10-CM | POA: Insufficient documentation

## 2021-05-18 DIAGNOSIS — Z124 Encounter for screening for malignant neoplasm of cervix: Secondary | ICD-10-CM | POA: Diagnosis not present

## 2021-05-18 DIAGNOSIS — R030 Elevated blood-pressure reading, without diagnosis of hypertension: Secondary | ICD-10-CM | POA: Diagnosis not present

## 2021-05-18 NOTE — Assessment & Plan Note (Signed)
UTD on dental and eye exam Things to do to keep yourself healthy  - Exercise at least 30-45 minutes a day, 3-4 days a week.  - Eat a low-fat diet with lots of fruits and vegetables, up to 7-9 servings per day.  - Seatbelts can save your life. Wear them always.  - Smoke detectors on every level of your home, check batteries every year.  - Eye Doctor - have an eye exam every 1-2 years  - Safe sex - if you may be exposed to STDs, use a condom.  - Alcohol -  If you drink, do it moderately, less than 2 drinks per day.  - [place]. Choose someone to speak for you if you are not able.  - Depression is common in our stressful world.If you're feeling down or losing interest in things you normally enjoy, please come in for a visit.  - Violence - If anyone is threatening or hurting you, please call immediately.

## 2021-05-18 NOTE — Progress Notes (Signed)
Complete physical exam   Patient: Kristi Dominguez   DOB: [birth year]   55 y.o. Female  MRN: [id number] Visit Date: 05/18/2021  Today's healthcare provider: Gwyneth Sprout, FNP   Chief Complaint  Patient presents with   Annual Exam   Subjective     HPI  Kristi Dominguez is a 55 y.o. female who presents today for a complete physical exam.  She reports consuming a general diet. The patient does not participate in regular exercise at present. She generally feels fairly well. She reports sleeping fairly well, patient reports trouble staying asleep. She does not have additional problems to discuss today.  Last Reported Pap-05/22/2016 Mammogram 09/20/2020 Colonoscopy-08/13/2018 Past Medical History:  Diagnosis Date   Depression    Dysplastic nevus 2004   pubic area - treated in [place]   Dysplastic nevus unsure of exact year   back, R hand, R foot, legs - treated in [place]   Dysplastic nevus 05/17/2020   L upper med thigh - mod   Dysplastic nevus 05/17/2020   R lat scapula - mod   Eczema    Past Surgical History:  Procedure Laterality Date   APPENDECTOMY  1996   COLONOSCOPY WITH PROPOFOL N/A 08/13/2018   Procedure: COLONOSCOPY WITH PROPOFOL;  Surgeon: Jonathon Bellows, MD;  Location: [facility] [place] [facility];  Service: Gastroenterology;  Laterality: N/A;   TONSILLECTOMY  1990   VAGINAL DELIVERY  2004,2006,2008   Social History   Socioeconomic History   Marital status: Married    Spouse name: Not on file   Number of children: Not on file   Years of education: Not on file   Highest education level: Not on file  Occupational History   Not on file  Tobacco Use   Smoking status: Never   Smokeless tobacco: Never  Vaping Use   Vaping Use: Never used  Substance and Sexual Activity   Alcohol use: No    Alcohol/week: 0.0 standard drinks   Drug use: No   Sexual activity: Yes    Birth control/protection: None  Other Topics Concern   Not on file  Social History Narrative   Not on  file   Social Determinants of Health   Financial Resource Strain: Not on file  Food Insecurity: Not on file  Transportation Needs: Not on file  Physical Activity: Not on file  Stress: Not on file  Social Connections: Not on file  Intimate Partner Violence: Not on file   Family Status  Relation Name Status   Mother  Alive   Father  Alive   [name]  (Not Specified)   Family History  Problem Relation Age of Onset   Kidney failure Mother    Hypertension Mother    Hyperlipidemia Mother    Heart attack Mother    Obesity Mother    Depression Mother    Heart attack Father    Hyperlipidemia Father    Hypertension Father    Depression Father    Breast cancer Paternal Aunt 36   Allergies  Allergen Reactions   Tetracycline Other (See Comments)    Patient Care Team: Gwyneth Sprout, FNP as PCP - General (Family Medicine)   Medications: Outpatient Medications Prior to Visit  Medication Sig   clobetasol cream (TEMOVATE) 0.05 % Apply topically to itchy rash on right elbow until improved. Avoid face, groin, axilla.   Clobetasol Prop Emollient Base (CLOBETASOL PROPIONATE E) 0.05 % emollient cream Apply to affected area right arm as needed for itch  twice daily. Avoid face, groin, underarms.   ondansetron (ZOFRAN) 4 MG tablet Take 1 tablet (4 mg total) by mouth every 8 (eight) hours as needed.   Ruxolitinib Phosphate (OPZELURA) 1.5 % CREA Apply 1 application topically in the morning and at bedtime.   SUMAtriptan (IMITREX) 100 MG tablet Take 1 tablet (100 mg total) by mouth every 2 (two) hours as needed for migraine. Do NOT take more than 200mg  in 24 hours   venlafaxine XR (EFFEXOR-XR) 150 MG 24 hr capsule Take 1 capsule (150 mg total) by mouth daily with breakfast.   Vitamin D, Ergocalciferol, (DRISDOL) 1.25 MG (50000 UNIT) CAPS capsule TAKE 1 CAPSULE EVERY 7 DAYS   [DISCONTINUED] cetirizine (ZYRTEC) 10 MG tablet Take 10 mg by mouth daily.   No facility-administered medications prior  to visit.    Review of Systems  Constitutional:  Positive for fatigue and unexpected weight change.  HENT:  Positive for congestion, rhinorrhea and sinus pressure.   Skin:  Positive for rash.  Neurological:  Positive for headaches.  Psychiatric/Behavioral:  The patient is nervous/anxious.      Objective    BP (!) 157/97   Pulse 85   Temp (!) 97 F (36.1 C) (Oral)   Resp 16   Ht 5\' 7"  (1.702 m)   Wt 159 lb (72.1 kg)   SpO2 100%   BMI 24.90 kg/m    Physical Exam Vitals and nursing note reviewed.  Constitutional:      General: She is awake. She is not in acute distress.    Appearance: Normal appearance. She is well-developed, well-groomed and normal weight. She is not ill-appearing, toxic-appearing or diaphoretic.  HENT:     Head: Normocephalic and atraumatic.     Jaw: There is normal jaw occlusion. No trismus, tenderness, swelling or pain on movement.     Right Ear: Hearing, tympanic membrane, ear canal and external ear normal. There is no impacted cerumen.     Left Ear: Hearing, tympanic membrane, ear canal and external ear normal. There is no impacted cerumen.     Nose: Nose normal. No congestion or rhinorrhea.     Right Turbinates: Not enlarged, swollen or pale.     Left Turbinates: Not enlarged, swollen or pale.     Right Sinus: No maxillary sinus tenderness or frontal sinus tenderness.     Left Sinus: No maxillary sinus tenderness or frontal sinus tenderness.     Mouth/Throat:     Lips: Pink.     Mouth: Mucous membranes are moist. No injury.     Tongue: No lesions.     Pharynx: Oropharynx is clear. Uvula midline. No pharyngeal swelling, oropharyngeal exudate, posterior oropharyngeal erythema or uvula swelling.     Tonsils: No tonsillar exudate or tonsillar abscesses.  Eyes:     General: Lids are normal. Lids are everted, no foreign bodies appreciated. Vision grossly intact. Gaze aligned appropriately. No allergic shiner or visual field deficit.       Right eye: No  discharge.        Left eye: No discharge.     Extraocular Movements: Extraocular movements intact.     Conjunctiva/sclera: Conjunctivae normal.     Right eye: Right conjunctiva is not injected. No exudate.    Left eye: Left conjunctiva is not injected. No exudate.    Pupils: Pupils are equal, round, and reactive to light.  Neck:     Thyroid: No thyroid mass, thyromegaly or thyroid tenderness.     Vascular: No carotid  bruit.     Trachea: Trachea normal.  Cardiovascular:     Rate and Rhythm: Normal rate and regular rhythm.     Pulses: Normal pulses.          Carotid pulses are 2+ on the right side and 2+ on the left side.      Radial pulses are 2+ on the right side and 2+ on the left side.       Dorsalis pedis pulses are 2+ on the right side and 2+ on the left side.       Posterior tibial pulses are 2+ on the right side and 2+ on the left side.     Heart sounds: Normal heart sounds, S1 normal and S2 normal. No murmur heard.   No friction rub. No gallop.  Pulmonary:     Effort: Pulmonary effort is normal. No respiratory distress.     Breath sounds: Normal breath sounds and air entry. No stridor. No wheezing, rhonchi or rales.  Chest:     Chest wall: No tenderness.       Comments: Breast exam WDL despite known cyst on R breast that she plans to have removed by derm; discussed self exam Abdominal:     General: Abdomen is flat. Bowel sounds are normal. There is no distension.     Palpations: Abdomen is soft. There is no mass.     Tenderness: There is no abdominal tenderness. There is no right CVA tenderness, left CVA tenderness, guarding or rebound.     Hernia: No hernia is present.  Genitourinary:    Comments: Exam deferred; denies complaints Musculoskeletal:        General: No swelling, tenderness, deformity or signs of injury. Normal range of motion.     Cervical back: Full passive range of motion without pain, normal range of motion and neck supple. No edema, rigidity or  tenderness. No muscular tenderness.     Right lower leg: No edema.     Left lower leg: No edema.  Lymphadenopathy:     Cervical: No cervical adenopathy.     Right cervical: No superficial, deep or posterior cervical adenopathy.    Left cervical: No superficial, deep or posterior cervical adenopathy.  Skin:    General: Skin is warm and dry.     Capillary Refill: Capillary refill takes less than 2 seconds.     Coloration: Skin is not jaundiced or pale.     Findings: No bruising, erythema, lesion or rash.  Neurological:     General: No focal deficit present.     Mental Status: She is alert and oriented to person, place, and time. Mental status is at baseline.     GCS: GCS eye subscore is 4. GCS verbal subscore is 5. GCS motor subscore is 6.     Sensory: Sensation is intact. No sensory deficit.     Motor: Motor function is intact. No weakness.     Coordination: Coordination is intact. Coordination normal.     Gait: Gait is intact. Gait normal.  Psychiatric:        Attention and Perception: Attention and perception normal.        Mood and Affect: Mood is anxious. Affect is tearful.        Speech: Speech normal.        Behavior: Behavior normal. Behavior is cooperative.        Thought Content: Thought content normal.        Cognition and Memory: Cognition and memory  normal.        Judgment: Judgment normal.     Comments: Tearful over being in PCP office after being here with her dad, who recently passed     Last depression screening scores PHQ 2/9 Scores 05/18/2021 02/01/2021 10/12/2019  PHQ - 2 Score 2 3 0  PHQ- 9 Score 7 7 3    Last fall risk screening Fall Risk  02/01/2021  Falls in the past year? 0  Number falls in past yr: 0  Injury with Fall? 0   Last Audit-C alcohol use screening Alcohol Use Disorder Test (AUDIT) 02/01/2021  1. How often do you have a drink containing alcohol? 0  2. How many drinks containing alcohol do you have on a typical day when you are drinking? 0  3.  How often do you have six or more drinks on one occasion? 0  AUDIT-C Score 0   A score of 3 or more in women, and 4 or more in men indicates increased risk for alcohol abuse, EXCEPT if all of the points are from question 1   No results found for any visits on 05/18/21.  Assessment & Plan    Routine Health Maintenance and Physical Exam  Exercise Activities and Dietary recommendations  Goals   None     Immunization History  Administered Date(s) Administered   Influenza,inj,Quad PF,6+ Mos 04/06/2014, 05/18/2015, 05/22/2016, 07/09/2018   Tdap 10/09/2011, 05/22/2016    Health Maintenance  Topic Date Due   Pneumococcal Vaccine 69-37 Years old (1 - PCV) Never done   Hepatitis C Screening  Never done   Zoster Vaccines- Shingrix (1 of 2) Never done   PAP SMEAR-Modifier  05/22/2021   INFLUENZA VACCINE  05/26/2021 (Originally 02/20/2021)   MAMMOGRAM  09/06/2022   TETANUS/TDAP  05/22/2026   COLONOSCOPY (Pts 45-31yrs Insurance coverage will need to be confirmed)  08/13/2028   HIV Screening  Completed   HPV VACCINES  Aged Out    Discussed health benefits of physical activity, and encouraged her to engage in regular exercise appropriate for her age and condition.  Problem List Items Addressed This Visit       Cardiovascular and Mediastinum   Migraine without aura and without status migrainosus, not intractable    Acute, infrequent Last migraine triggered from lack of sleep the day after her dad passed PRNs working well; denies need for refills at this time        Other   Screening for cervical cancer    Normal exam; no previous abnormal PAP       Relevant Orders   Cytology - PAP   Elevated LDL cholesterol level    Previously elevated; repeat lipid panel Plan for fasting labs per pt preference recommend diet low in saturated fat and regular exercise - 30 min at least 5 times per week       Relevant Orders   Lipid panel   Annual physical exam - Primary    UTD on dental  and eye exam Things to do to keep yourself healthy  - Exercise at least 30-45 minutes a day, 3-4 days a week.  - Eat a low-fat diet with lots of fruits and vegetables, up to 7-9 servings per day.  - Seatbelts can save your life. Wear them always.  - Smoke detectors on every level of your home, check batteries every year.  - Eye Doctor - have an eye exam every 1-2 years  - Safe sex - if you may be exposed to  STDs, use a condom.  - Alcohol -  If you drink, do it moderately, less than 2 drinks per day.  - [place]. Choose someone to speak for you if you are not able.  - Depression is common in our stressful world.If you're feeling down or losing interest in things you normally enjoy, please come in for a visit.  - Violence - If anyone is threatening or hurting you, please call immediately.        Elevated blood pressure reading in office with white coat syndrome, without diagnosis of hypertension    BP taken x2; pt with acute anxiety related to being in PCP office following death of father Will do RN visit to check BP when return for lipid panel No previous elevation Denies CP, SOB, no LE edema        Return in about 1 year (around 05/18/2022) for annual examination.     Vonna Kotyk, FNP, have reviewed all documentation for this visit. The documentation on 05/18/21 for the exam, diagnosis, procedures, and orders are all accurate and complete.    Gwyneth Sprout, [place] [phone] (phone) [phone] (fax)  [place]

## 2021-05-18 NOTE — Assessment & Plan Note (Signed)
Previously elevated; repeat lipid panel Plan for fasting labs per pt preference recommend diet low in saturated fat and regular exercise - 30 min at least 5 times per week

## 2021-05-18 NOTE — Assessment & Plan Note (Signed)
Normal exam; no previous abnormal PAP

## 2021-05-18 NOTE — Assessment & Plan Note (Signed)
Acute, infrequent Last migraine triggered from lack of sleep the day after her dad passed PRNs working well; denies need for refills at this time

## 2021-05-18 NOTE — Assessment & Plan Note (Signed)
BP taken x2; pt with acute anxiety related to being in PCP office following death of father Will do RN visit to check BP when return for lipid panel No previous elevation Denies CP, SOB, no LE edema

## 2021-05-20 LAB — LIPID PANEL
Chol/HDL Ratio: 3.6 ratio (ref 0.0–4.4)
Cholesterol, Total: 245 mg/dL — ABNORMAL HIGH (ref 100–199)
HDL: 69 mg/dL (ref >39)
LDL Chol Calc (NIH): 169 mg/dL — ABNORMAL HIGH (ref 0–99)
Triglycerides: 46 mg/dL (ref 0–149)
VLDL Cholesterol Cal: 7 mg/dL (ref 5–40)

## 2021-05-23 LAB — IGP, APTIMA HPV, RFX 16/18,45
HPV Aptima: NEGATIVE
PAP Smear Comment: 0

## 2021-06-05 ENCOUNTER — Other Ambulatory Visit: Payer: Self-pay

## 2021-06-05 ENCOUNTER — Ambulatory Visit: Payer: No Typology Code available for payment source | Admitting: Dermatology

## 2021-06-05 DIAGNOSIS — D492 Neoplasm of unspecified behavior of bone, soft tissue, and skin: Secondary | ICD-10-CM

## 2021-06-05 DIAGNOSIS — L720 Epidermal cyst: Secondary | ICD-10-CM

## 2021-06-05 NOTE — Patient Instructions (Signed)
Wound Care Instructions  Cleanse wound gently with soap and water once a day then pat dry with clean gauze. Apply a thing coat of Petrolatum (petroleum jelly, "Vaseline") over the wound (unless you have an allergy to this). We recommend that you use a new, sterile tube of Vaseline. Do not pick or remove scabs. Do not remove the yellow or white "healing tissue" from the base of the wound.  Cover the wound with fresh, clean, nonstick gauze and secure with paper tape. You may use Band-Aids in place of gauze and tape if the would is small enough, but would recommend trimming much of the tape off as there is often too much. Sometimes Band-Aids can irritate the skin.  You should call the office for your biopsy report after 1 week if you have not already been contacted.  If you experience any problems, such as abnormal amounts of bleeding, swelling, significant bruising, significant pain, or evidence of infection, please call the office immediately.  FOR ADULT SURGERY PATIENTS: If you need something for pain relief you may take 1 extra strength Tylenol (acetaminophen) AND 2 Ibuprofen (200mg each) together every 4 hours as needed for pain. (do not take these if you are allergic to them or if you have a reason you should not take them.) Typically, you may only need pain medication for 1 to 3 days.   If you have any questions or concerns for your doctor, please call our main line at [phone] and press option 4 to reach your doctor's medical assistant. If no one answers, please leave a voicemail as directed and we will return your call as soon as possible. Messages left after 4 pm will be answered the following business day.   You may also send us a message via MyChart. We typically respond to MyChart messages within 1-2 business days.  For prescription refills, please ask your pharmacy to contact our office. Our fax number is [phone].  If you have an urgent issue when the clinic is closed that  cannot wait until the next business day, you can page your doctor at the number below.    Please note that while we do our best to be available for urgent issues outside of office hours, we are not available 24/7.   If you have an urgent issue and are unable to reach us, you may choose to seek medical care at your doctor's office, retail clinic, urgent care center, or emergency room.  If you have a medical emergency, please immediately call 911 or go to the emergency department.  Pager Numbers  - Dr. Kowalski: [phone]  - Dr. Moye: [phone]  - Dr. Stewart: [phone]  In the event of inclement weather, please call our main line at [phone] for an update on the status of any delays or closures.  Dermatology Medication Tips: Please keep the boxes that topical medications come in in order to help keep track of the instructions about where and how to use these. Pharmacies typically print the medication instructions only on the boxes and not directly on the medication tubes.   If your medication is too expensive, please contact our office at [phone] option 4 or send us a message through MyChart.   We are unable to tell what your co-pay for medications will be in advance as this is different depending on your insurance coverage. However, we may be able to find a substitute medication at lower cost or fill out paperwork to get insurance to cover a needed   medication.   If a prior authorization is required to get your medication covered by your insurance company, please allow us 1-2 business days to complete this process.  Drug prices often vary depending on where the prescription is filled and some pharmacies may offer cheaper prices.  The website www.goodrx.com contains coupons for medications through different pharmacies. The prices here do not account for what the cost may be with help from insurance (it may be cheaper with your insurance), but the website can give you the  price if you did not use any insurance.  - You can print the associated coupon and take it with your prescription to the pharmacy.  - You may also stop by our office during regular business hours and pick up a GoodRx coupon card.  - If you need your prescription sent electronically to a different pharmacy, notify our office through [facility] MyChart or by phone at [phone] option 4.   

## 2021-06-05 NOTE — Progress Notes (Signed)
   Follow-Up Visit   Subjective  Kristi Dominguez is a 55 y.o. female who presents for the following: Cyst (R medial breast, pt presents for excision).   The following portions of the chart were reviewed this encounter and updated as appropriate:       Review of Systems:  No other skin or systemic complaints except as noted in HPI or Assessment and Plan.  Objective  Well appearing patient in no apparent distress; mood and affect are within normal limits.  A focused examination was performed including chest. Relevant physical exam findings are noted in the Assessment and Plan.  R medial breast SQ nodule 1.4cm   Assessment & Plan  Neoplasm of skin R medial breast  Skin excision  Lesion length (cm):  1.4 Lesion width (cm):  1.4 Margin per side (cm):  0.1 Total excision diameter (cm):  1.6 Informed consent: discussed and consent obtained   Timeout: patient name, date of birth, surgical site, and procedure verified   Procedure prep:  Patient was prepped and draped in usual sterile fashion Prep type:  Povidone-iodine Anesthesia: the lesion was anesthetized in a standard fashion   Anesthetic:  1% lidocaine w/ epinephrine 1-100,000 buffered w/ 8.4% NaHCO3 (3cc lido w/ epi, 9cc bupivicaine, Total 12cc) Instrument used comment:  #15c blade Hemostasis achieved with: pressure and electrodesiccation   Outcome: patient tolerated procedure well with no complications    Skin repair Complexity:  Intermediate Final length (cm):  1.5 Informed consent: discussed and consent obtained   Timeout: patient name, date of birth, surgical site, and procedure verified   Reason for type of repair: reduce tension to allow closure, reduce the risk of dehiscence, infection, and necrosis, reduce subcutaneous dead space and avoid a hematoma, preserve normal anatomical and functional relationships and enhance both functionality and cosmetic results   Undermining: edges undermined   Subcutaneous layers  (deep stitches):  Suture size:  4-0 Suture type: Vicryl (polyglactin 910)   Subcutaneous suture technique: inverted dermal. Fine/surface layer approximation (top stitches):  Suture size:  4-0 Suture type: nylon   Suture type comment:  Nylon Stitches: simple interrupted   Suture removal (days):  7 Hemostasis achieved with: suture Outcome: patient tolerated procedure well with no complications   Post-procedure details: sterile dressing applied and wound care instructions given   Dressing type: pressure dressing (Mupirocin ointment)    Specimen 1 - Surgical pathology Differential Diagnosis: D48.5 Cyst vs other  Check Margins: No Cystic pap 1.4cm  Return in about 1 week (around 06/12/2021) for suture removal.  I, Sonya Hupman, RMA, am acting as scribe for Brendolyn Patty, MD .  Documentation: I have reviewed the above documentation for accuracy and completeness, and I agree with the above.  Brendolyn Patty MD

## 2021-06-06 ENCOUNTER — Telehealth: Payer: Self-pay

## 2021-06-06 NOTE — Telephone Encounter (Signed)
Pt doing fine after yesterdays surgery./sh 

## 2021-06-12 ENCOUNTER — Other Ambulatory Visit: Payer: Self-pay

## 2021-06-12 ENCOUNTER — Ambulatory Visit: Payer: No Typology Code available for payment source | Admitting: Dermatology

## 2021-06-12 DIAGNOSIS — L720 Epidermal cyst: Secondary | ICD-10-CM

## 2021-06-12 DIAGNOSIS — B079 Viral wart, unspecified: Secondary | ICD-10-CM | POA: Diagnosis not present

## 2021-06-12 NOTE — Progress Notes (Signed)
   Follow-Up Visit   Subjective  Kristi Dominguez is a 55 y.o. female who presents for the following: Cyst bx proven (1 wk post op, R medial breast) and wart (R foot, just noticed).   The following portions of the chart were reviewed this encounter and updated as appropriate:       Review of Systems:  No other skin or systemic complaints except as noted in HPI or Assessment and Plan.  Objective  Well appearing patient in no apparent distress; mood and affect are within normal limits.  A focused examination was performed including breast, R foot. Relevant physical exam findings are noted in the Assessment and Plan.  R medial breast Healing excision site  R plantar foot at ball x 1 3.17mm firm flesh pap   Assessment & Plan  Epidermal cyst R medial breast  Bx proven benign cyst  Encounter for Removal of Sutures - Incision site at the right medial breast is clean, dry and intact - Wound cleansed, sutures removed, wound cleansed and steri strips applied.  - Discussed pathology results showing epidermal cyst  - Patient advised to keep steri-strips dry until they fall off. - Scars remodel for a full year. - Once steri-strips fall off, patient can apply over-the-counter silicone scar cream each night to help with scar remodeling if desired. - Patient advised to call with any concerns or if they notice any new or changing lesions.   Viral warts, unspecified type R plantar foot at ball x 1  Discussed viral etiology and risk of spread.  Discussed multiple treatments may be required to clear warts.  Discussed possible post-treatment dyspigmentation and risk of recurrence.  Recommend Curad Mediplast  Destruction of lesion - R plantar foot at ball x 1  Destruction method: cryotherapy   Informed consent: discussed and consent obtained   Lesion destroyed using liquid nitrogen: Yes   Region frozen until ice ball extended beyond lesion: Yes   Outcome: patient tolerated procedure  well with no complications   Post-procedure details: wound care instructions given   Additional details:  Prior to procedure, discussed risks of blister formation, small wound, skin dyspigmentation, or rare scar following cryotherapy. Recommend Vaseline ointment to treated areas while healing.   Return in about 1 month (around 07/12/2021) for viral wart.  I, Othelia Pulling, RMA, am acting as scribe for Brendolyn Patty, MD .  Documentation: I have reviewed the above documentation for accuracy and completeness, and I agree with the above.  Brendolyn Patty MD

## 2021-06-12 NOTE — Patient Instructions (Addendum)
If You Need Anything After Your Visit  If you have any questions or concerns for your doctor, please call our main line at [phone] and press option 4 to reach your doctor's medical assistant. If no one answers, please leave a voicemail as directed and we will return your call as soon as possible. Messages left after 4 pm will be answered the following business day.   You may also send us a message via MyChart. We typically respond to MyChart messages within 1-2 business days.  For prescription refills, please ask your pharmacy to contact our office. Our fax number is [phone].  If you have an urgent issue when the clinic is closed that cannot wait until the next business day, you can page your doctor at the number below.    Please note that while we do our best to be available for urgent issues outside of office hours, we are not available 24/7.   If you have an urgent issue and are unable to reach us, you may choose to seek medical care at your doctor's office, retail clinic, urgent care center, or emergency room.  If you have a medical emergency, please immediately call 911 or go to the emergency department.  Pager Numbers  - Dr. Kowalski: [phone]  - Dr. Moye: [phone]  - Dr. Stewart: [phone]  In the event of inclement weather, please call our main line at [phone] for an update on the status of any delays or closures.  Dermatology Medication Tips: Please keep the boxes that topical medications come in in order to help keep track of the instructions about where and how to use these. Pharmacies typically print the medication instructions only on the boxes and not directly on the medication tubes.   If your medication is too expensive, please contact our office at [phone] option 4 or send us a message through MyChart.   We are unable to tell what your co-pay for medications will be in advance as this is different depending on your insurance coverage.  However, we may be able to find a substitute medication at lower cost or fill out paperwork to get insurance to cover a needed medication.   If a prior authorization is required to get your medication covered by your insurance company, please allow us 1-2 business days to complete this process.  Drug prices often vary depending on where the prescription is filled and some pharmacies may offer cheaper prices.  The website www.goodrx.com contains coupons for medications through different pharmacies. The prices here do not account for what the cost may be with help from insurance (it may be cheaper with your insurance), but the website can give you the price if you did not use any insurance.  - You can print the associated coupon and take it with your prescription to the pharmacy.  - You may also stop by our office during regular business hours and pick up a GoodRx coupon card.  - If you need your prescription sent electronically to a different pharmacy, notify our office through [facility] MyChart or by phone at [phone] option 4.     Si Usted Necesita Algo Despus de Su Visita  Tambin puede enviarnos un mensaje a travs de MyChart. Por lo general respondemos a los mensajes de MyChart en el transcurso de 1 a 2 das hbiles.  Para renovar recetas, por favor pida a su farmacia que se ponga en contacto con nuestra oficina. Nuestro nmero de fax es el [phone].  Si tiene   un asunto urgente cuando la clnica est cerrada y que no puede esperar hasta el siguiente da hbil, puede llamar/localizar a su doctor(a) al nmero que aparece a continuacin.   Por favor, tenga en cuenta que aunque hacemos todo lo posible para estar disponibles para asuntos urgentes fuera del horario de [place], no estamos disponibles las 24 horas del da, los 7 das de la [place].   Si tiene un problema urgente y no puede comunicarse con nosotros, puede optar por buscar atencin mdica  en el consultorio de su  doctor(a), en una clnica privada, en un centro de atencin urgente o en una sala de emergencias.  Si tiene Engineering geologist, por favor llame inmediatamente al 911 o vaya a la sala de emergencias.  Nmeros de bper  - Dr. Nehemiah Massed: [phone]  - Dra. Moye: [phone]  - Dra. Nicole Kindred: [phone]  En caso de inclemencias del [place], por favor llame a Johnsie Kindred principal al [phone] para una actualizacin sobre el [place] de cualquier retraso o cierre.  Consejos para la medicacin en dermatologa: Por favor, guarde las cajas en las que vienen los medicamentos de uso tpico para ayudarle a seguir las instrucciones sobre dnde y cmo usarlos. Las farmacias generalmente imprimen las instrucciones del medicamento slo en las cajas y no directamente en los tubos del [place].   Si su medicamento es muy caro, por favor, pngase en contacto con Zigmund Daniel llamando al [phone] y presione la opcin 4 o envenos un mensaje a travs de Pharmacist, community.   No podemos decirle cul ser su copago por los medicamentos por adelantado ya que esto es diferente dependiendo de la cobertura de su seguro. Sin embargo, es posible que podamos encontrar un medicamento sustituto a Electrical engineer un formulario para que el seguro cubra el medicamento que se considera necesario.   Si se requiere una autorizacin previa para que su compaa de seguros [place] su medicamento, por favor permtanos de 1 a 2 das hbiles para completar este proceso.  Los precios de los medicamentos varan con frecuencia dependiendo del Environmental consultant de dnde se surte la receta y alguna farmacias pueden ofrecer precios ms baratos.  El sitio web www.goodrx.com tiene cupones para medicamentos de Airline pilot. Los precios aqu no tienen en cuenta lo que podra costar con la ayuda del seguro (puede ser ms barato con su seguro), pero el sitio web puede darle el precio si no utiliz Research scientist (physical sciences).  - Puede imprimir el cupn  correspondiente y llevarlo con su receta a la farmacia.  - Tambin puede pasar por nuestra oficina durante el horario de atencin regular y Charity fundraiser una tarjeta de cupones de GoodRx.  - Si necesita que su receta se enve electrnicamente a una farmacia diferente, informe a nuestra oficina a travs de MyChart de [place] o por telfono llamando al [phone] y presione la opcin 4.   Recommend Curad Mediplast for wart on foot

## 2021-07-18 ENCOUNTER — Ambulatory Visit: Payer: No Typology Code available for payment source | Admitting: Dermatology

## 2021-07-18 ENCOUNTER — Ambulatory Visit (INDEPENDENT_AMBULATORY_CARE_PROVIDER_SITE_OTHER): Payer: No Typology Code available for payment source | Admitting: Dermatology

## 2021-07-18 ENCOUNTER — Other Ambulatory Visit: Payer: Self-pay

## 2021-07-18 DIAGNOSIS — B078 Other viral warts: Secondary | ICD-10-CM

## 2021-07-18 DIAGNOSIS — R21 Rash and other nonspecific skin eruption: Secondary | ICD-10-CM

## 2021-07-18 DIAGNOSIS — L239 Allergic contact dermatitis, unspecified cause: Secondary | ICD-10-CM | POA: Diagnosis not present

## 2021-07-18 MED ORDER — CLOBETASOL PROPIONATE 0.05 % EX CREA: 1.0000 "application " | TOPICAL_CREAM | Freq: Two times a day (BID) | CUTANEOUS | 0 refills | Status: DC

## 2021-07-18 NOTE — Progress Notes (Signed)
Follow-Up Visit   Subjective  Kristi Dominguez is a 55 y.o. female who presents for the following: Warts (Patient here today for a 1 month wart follow up at the right plantar foot. Area treated previously with LN2, improved per patient. ) and Rash (Patient with itchy rash at left ankle since last Wednesday, patient feels it looks like poison ivy which she is highly allergic to. ).  She has dogs that go outside and that went on recent hunting trip before onset of rash.  Also was walking outside at [facility], but had on socks/shoes. No other known exposure.  Denies any pain in area.   The following portions of the chart were reviewed this encounter and updated as appropriate:       Review of Systems:  No other skin or systemic complaints except as noted in HPI or Assessment and Plan.  Objective  Well appearing patient in no apparent distress; mood and affect are within normal limits.  A focused examination was performed including feet, ankles. Relevant physical exam findings are noted in the Assessment and Plan.  Right plantar foot at ball 1.5 mm firm depressed papule  Left Medial Ankle Scaly erythematous papules and plaques +/- edema and vesiculation.     Assessment & Plan  Other viral warts Right plantar foot at ball  Improving. Discussed viral etiology and risk of spread.  Discussed multiple treatments may be required to clear warts.  Discussed possible post-treatment dyspigmentation and risk of recurrence.  Destruction of lesion - Right plantar foot at ball  Destruction method: cryotherapy   Informed consent: discussed and consent obtained   Lesion destroyed using liquid nitrogen: Yes   Region frozen until ice ball extended beyond lesion: Yes   Outcome: patient tolerated procedure well with no complications   Post-procedure details: wound care instructions given   Additional details:  Prior to procedure, discussed risks of blister formation, small wound, skin  dyspigmentation, or rare scar following cryotherapy. Recommend Vaseline ointment to treated areas while healing.   Destruction of lesion - Right plantar foot at ball  Destruction method: chemical removal   Informed consent: discussed and consent obtained   Timeout:  patient name, date of birth, surgical site, and procedure verified Chemical destruction method: cantharidin   Chemical destruction method comment:  Cantharidin plus Application time:  6 hours Procedure instructions: patient instructed to wash and dry area   Outcome: patient tolerated procedure well with no complications   Post-procedure details: wound care instructions given   Additional details:  Cantharidin Plus is a blistering agent that comes from a beetle.  It needs to be washed off in about 4-6 hours after application.  Although it is painless when applied in office, it may cause symptoms of mild pain and burning several hours later.  Treated areas will swell and turn red, and blisters may form.  Vaseline and a bandaid may be applied until wound has healed.  Once healed, the skin may remain temporarily discolored.  It can take weeks to months for pigmentation to return to normal.  Advised to wash off with soap and water in 6 hours or sooner if it becomes tender before then.   Rash Left Medial Ankle  Contact dermatitis, probable poison ivy exposure from outside dog  Start clobetasol 0.05% cream twice daily to rash.Avoid applying to face, groin, and axilla. Use as directed. Long-term use can cause thinning of the skin.  Topical steroids (such as triamcinolone, fluocinolone, fluocinonide, mometasone, clobetasol, halobetasol, betamethasone,  hydrocortisone) can cause thinning and lightening of the skin if they are used for too long in the same area. Your physician has selected the right strength medicine for your problem and area affected on the body. Please use your medication only as directed by your physician to prevent side  effects.    clobetasol cream (TEMOVATE) 0.05 % - Left Medial Ankle Apply 1 application topically 2 (two) times daily. Avoid applying to face, groin, and axilla. Use as directed. Long-term use can cause thinning of the skin.   Return for TBSE, as scheduled, 1 month wart.  Graciella Belton, RMA, am acting as scribe for Brendolyn Patty, MD .  Documentation: I have reviewed the above documentation for accuracy and completeness, and I agree with the above.  Brendolyn Patty MD

## 2021-07-18 NOTE — Patient Instructions (Addendum)
Cryotherapy Aftercare  Wash gently with soap and water everyday.   Apply Vaseline and Band-Aid daily until healed.   Instructions for After In-Office Application of Cantharidin  1. This is a strong medicine; please follow ALL instructions.  2. Gently wash off with soap and water in six hours or sooner as directed by your physician.  3. **WARNING** this medicine can cause severe blistering, blood blisters, infection, and/or scarring if it is not washed off as directed.  4. Your progress will be rechecked in 1-2 months; call sooner if there are any questions or problems.  Cantharidin Plus is a blistering agent that comes from a beetle.  It needs to be washed off in about 6 hours after application.  Although it is painless when applied in office, it may cause symptoms of mild pain and burning several hours later.  Treated areas will swell and turn red, and blisters may form.  Vaseline and a bandaid may be applied until wound has healed.  Once healed, the skin may remain temporarily discolored.  It can take weeks to months for pigmentation to return to normal.  Advised to wash off with soap and water in 6 hours or sooner if it becomes tender before then.  Topical steroids (such as triamcinolone, fluocinolone, fluocinonide, mometasone, clobetasol, halobetasol, betamethasone, hydrocortisone) can cause thinning and lightening of the skin if they are used for too long in the same area. Your physician has selected the right strength medicine for your problem and area affected on the body. Please use your medication only as directed by your physician to prevent side effects.   If You Need Anything After Your Visit  If you have any questions or concerns for your doctor, please call our main line at [phone] and press option 4 to reach your doctor's medical assistant. If no one answers, please leave a voicemail as directed and we will return your call as soon as possible. Messages left after 4 pm will  be answered the following business day.   You may also send [place] a message via [place]. We typically respond to MyChart messages within 1-2 business days.  For prescription refills, please ask your pharmacy to contact our office. Our fax number is [phone].  If you have an urgent issue when the clinic is closed that cannot wait until the next business day, you can page your doctor at the number below.    Please note that while we do our best to be available for urgent issues outside of office hours, we are not available 24/7.   If you have an urgent issue and are unable to reach [place], you may choose to seek medical care at your doctor's office, retail clinic, urgent care center, or emergency room.  If you have a medical emergency, please immediately call 911 or go to the emergency department.  Pager Numbers  - Dr. Nehemiah Massed: [phone]  - Dr. Laurence Ferrari: [phone]  - Dr. Nicole Kindred: [phone]  In the event of inclement weather, please call our main line at [phone] for an update on the status of any delays or closures.  Dermatology Medication Tips: Please keep the boxes that topical medications come in in order to help keep track of the instructions about where and how to use these. Pharmacies typically print the medication instructions only on the boxes and not directly on the medication tubes.   If your medication is too expensive, please contact our office at [phone] option 4 or send [place] a message through [place].  We are unable to tell what your co-pay for medications will be in advance as this is different depending on your insurance coverage. However, we may be able to find a substitute medication at lower cost or fill out paperwork to get insurance to cover a needed medication.   If a prior authorization is required to get your medication covered by your insurance company, please allow [place] 1-2 business days to complete this process.  Drug prices often vary depending on  where the prescription is filled and some pharmacies may offer cheaper prices.  The website www.goodrx.com contains coupons for medications through different pharmacies. The prices here do not account for what the cost may be with help from insurance (it may be cheaper with your insurance), but the website can give you the price if you did not use any insurance.  - You can print the associated coupon and take it with your prescription to the pharmacy.  - You may also stop by our office during regular business hours and pick up a GoodRx coupon card.  - If you need your prescription sent electronically to a different pharmacy, notify our office through [facility] or by phone at [phone] option 4.     Si Usted Necesita Algo Despus de Su Visita  Tambin puede enviarnos un mensaje a travs de Pharmacist, community. Por lo general respondemos a los mensajes de MyChart en el transcurso de 1 a 2 das hbiles.  Para renovar recetas, por favor pida a su farmacia que se ponga en contacto con nuestra oficina. Harland Dingwall de fax es [phone].  Si tiene un asunto urgente cuando la clnica est cerrada y que no puede esperar hasta el siguiente da hbil, puede llamar/localizar a su doctor(a) al nmero que aparece a continuacin.   Por favor, tenga en cuenta que aunque hacemos todo lo posible para estar disponibles para asuntos urgentes fuera del horario de [place], no estamos disponibles las 24 horas del da, los 7 das de la [place].   Si tiene un problema urgente y no puede comunicarse con nosotros, puede optar por buscar atencin mdica  en el consultorio de su doctor(a), en una clnica privada, en un centro de atencin urgente o en una sala de emergencias.  Si tiene Engineering geologist, por favor llame inmediatamente al 911 o vaya a la sala de emergencias.  Nmeros de bper  - Dr. Nehemiah Massed: [phone]  - Dra. Moye: [phone]  - Dra. Nicole Kindred: [phone]  En caso de inclemencias  del [place], por favor llame a Johnsie Kindred principal al [phone] para una actualizacin sobre el [place] de cualquier retraso o cierre.  Consejos para la medicacin en dermatologa: Por favor, guarde las cajas en las que vienen los medicamentos de uso tpico para ayudarle a seguir las instrucciones sobre dnde y cmo usarlos. Las farmacias generalmente imprimen las instrucciones del medicamento slo en las cajas y no directamente en los tubos del [place].   Si su medicamento es muy caro, por favor, pngase en contacto con Zigmund Daniel llamando al [phone] y presione la opcin 4 o envenos un mensaje a travs de Pharmacist, community.   No podemos decirle cul ser su copago por los medicamentos por adelantado ya que esto es diferente dependiendo de la cobertura de su seguro. Sin embargo, es posible que podamos encontrar un medicamento sustituto a Electrical engineer un formulario para que el seguro cubra el medicamento que se considera necesario.   Si se requiere una autorizacin previa para que su  compaa de seguros [place] su medicamento, por favor permtanos de 1 a 2 das hbiles para completar este proceso.  Los precios de los medicamentos varan con frecuencia dependiendo del Environmental consultant de dnde se surte la receta y alguna farmacias pueden ofrecer precios ms baratos.  El sitio web www.goodrx.com tiene cupones para medicamentos de Airline pilot. Los precios aqu no tienen en cuenta lo que podra costar con la ayuda del seguro (puede ser ms barato con su seguro), pero el sitio web puede darle el precio si no utiliz Research scientist (physical sciences).  - Puede imprimir el cupn correspondiente y llevarlo con su receta a la farmacia.  - Tambin puede pasar por nuestra oficina durante el horario de atencin regular y Charity fundraiser una tarjeta de cupones de GoodRx.  - Si necesita que su receta se enve electrnicamente a una farmacia diferente, informe a nuestra oficina a travs de MyChart de [place] o por telfono  llamando al [phone] y presione la opcin 4.

## 2021-07-29 ENCOUNTER — Other Ambulatory Visit: Payer: Self-pay | Admitting: Family Medicine

## 2021-07-29 DIAGNOSIS — R110 Nausea: Secondary | ICD-10-CM

## 2021-07-29 DIAGNOSIS — E559 Vitamin D deficiency, unspecified: Secondary | ICD-10-CM

## 2021-07-29 NOTE — Telephone Encounter (Signed)
Requested Prescriptions  Pending Prescriptions Disp Refills   ondansetron (ZOFRAN) 4 MG tablet [Pharmacy Med Name: ONDANSETRON HCL 4 MG TAB] 20 tablet     Sig: TAKE ONE TABLET EVERY EIGHT HOURS AS NEEDED     Not Delegated - Gastroenterology: Antiemetics Failed - 07/29/2021 10:26 AM      Failed - This refill cannot be delegated      Passed - Valid encounter within last 6 months    Recent Outpatient Visits          2 months ago Annual physical exam   [facility] Tally Joe T, FNP   3 months ago Recurrent major depressive disorder, in partial remission [facility])   [facility] Jerrol Banana., MD   5 months ago Recurrent major depressive disorder, in partial remission [facility])   [facility] Jerrol Banana., MD   1 year ago Hornet sting, accidental or unintentional, initial encounter   [facility] [place], Adriana M, PA-C   1 year ago Recurrent major depressive disorder, in partial remission [facility])   [place], Clearnce Sorrel, [place]      Future Appointments            In 3 weeks Brendolyn Patty, MD [place]   In 10 months Brendolyn Patty, MD [place]            venlafaxine XR (EFFEXOR-XR) 150 MG 24 hr capsule [Pharmacy Med Name: VENLAFAXINE HCL ER 150 MG CAP] 90 capsule 0    Sig: TAKE 1 CAPSULE BY MOUTH EVERY DAY WITH BREAKFAST     Psychiatry: Antidepressants - SNRI - desvenlafaxine & venlafaxine Failed - 07/29/2021 10:26 AM      Failed - LDL in normal range and within 360 days    LDL Chol Calc (NIH)  Date Value Ref Range Status  05/19/2021 169 (H) 0 - 99 mg/dL Final         Failed - Total Cholesterol in normal range and within 360 days    Cholesterol, Total  Date Value Ref Range Status  05/19/2021 245 (H) 100 - 199 mg/dL Final         Failed - Last BP in normal range    BP Readings from Last 1 Encounters:  05/18/21 (!) 157/97         Passed - Triglycerides in  normal range and within 360 days    Triglycerides  Date Value Ref Range Status  05/19/2021 46 0 - 149 mg/dL Final         Passed - Completed PHQ-2 or PHQ-9 in the last 360 days      Passed - Valid encounter within last 6 months    Recent Outpatient Visits          2 months ago Annual physical exam   [facility] Tally Joe T, FNP   3 months ago Recurrent major depressive disorder, in partial remission [facility])   [facility] Jerrol Banana[facility] MD   5 months ago Recurrent major depressive disorder, in partial remission [facility])   [facility] Jerrol Banana., MD   1 year ago Hornet sting, accidental or unintentional, initial encounter   [facility] [place], [place], PA-C   1 year ago Recurrent major depressive disorder, in partial remission [facility])   [place], Clearnce Sorrel, [place]      Future Appointments  In 3 weeks Brendolyn Patty, MD [place]   In 10 months Brendolyn Patty, MD [place]

## 2021-07-29 NOTE — Telephone Encounter (Signed)
Requested medication (s) are due for refill today: yes  Requested medication (s) are on the active medication list: yes  Last refill:  11/21/20 #12 1 RF  Future visit scheduled: no  Notes to clinic:  med not delegated to NT to RF   Requested Prescriptions  Pending Prescriptions Disp Refills   Vitamin D, Ergocalciferol, (DRISDOL) 1.25 MG (50000 UNIT) CAPS capsule [Pharmacy Med Name: VITAMIN D (ERGOCALCIFEROL) 1.25 MG] 12 capsule 1    Sig: TAKE 1 CAPSULE EVERY 7 DAYS     Endocrinology:  Vitamins - Vitamin D Supplementation Failed - 07/29/2021 10:26 AM      Failed - 50,000 IU strengths are not delegated      Failed - Ca in normal range and within 360 days    Calcium  Date Value Ref Range Status  10/20/2019 9.1 8.7 - 10.2 mg/dL Final          Failed - Phosphate in normal range and within 360 days    No results found for: PHOS        Failed - Vitamin D in normal range and within 360 days    Vit D, 25-Hydroxy  Date Value Ref Range Status  10/20/2019 56.0 30.0 - 100.0 ng/mL Final    Comment:    Vitamin D deficiency has been defined by the Institute of Medicine and an Endocrine Society practice guideline as a level of serum 25-OH vitamin D less than 20 ng/mL (1,2). The Endocrine Society went on to further define vitamin D insufficiency as a level between 21 and 29 ng/mL (2). 1. IOM (Institute of Medicine). 2010. Dietary reference    intakes for calcium and D. [place]: The    [employer]. 2. Holick MF, Binkley [place], Bischoff-Ferrari HA, et al.    Evaluation, treatment, and prevention of vitamin D    deficiency: an Endocrine Society clinical practice    guideline. JCEM. 2011 Jul; 96(7):1911-30.           Passed - Valid encounter within last 12 months    Recent Outpatient Visits           2 months ago Annual physical exam   [facility] Tally Joe T, FNP   3 months ago Recurrent major depressive disorder, in partial remission [facility])    [facility] Jerrol Banana., MD   5 months ago Recurrent major depressive disorder, in partial remission [facility])   [facility] Jerrol Banana., MD   1 year ago Hornet sting, accidental or unintentional, initial encounter   [place], PA-C   1 year ago Recurrent major depressive disorder, in partial remission [facility])   [place], Clearnce Sorrel, [place]       Future Appointments             In 3 weeks Brendolyn Patty, MD [place]   In 10 months Brendolyn Patty, MD [place]

## 2021-07-29 NOTE — Telephone Encounter (Signed)
Requested medication (s) are due for refill today: yes  Requested medication (s) are on the active medication list: yes  Last refill:  02/09/19 #20  Future visit scheduled: no  Notes to clinic:  med not delegated to NT to RF   Requested Prescriptions  Pending Prescriptions Disp Refills   ondansetron (ZOFRAN) 4 MG tablet [Pharmacy Med Name: ONDANSETRON HCL 4 MG TAB] 20 tablet     Sig: TAKE ONE TABLET EVERY EIGHT HOURS AS NEEDED     Not Delegated - Gastroenterology: Antiemetics Failed - 07/29/2021 10:26 AM      Failed - This refill cannot be delegated      Passed - Valid encounter within last 6 months    Recent Outpatient Visits           2 months ago Annual physical exam   [facility] Tally Joe T, FNP   3 months ago Recurrent major depressive disorder, in partial remission ([place])   [facility] Jerrol Banana., MD   5 months ago Recurrent major depressive disorder, in partial remission [facility])   [facility] Jerrol Banana., MD   1 year ago Hornet sting, accidental or unintentional, initial encounter   [facility] [place], Adriana M, PA-C   1 year ago Recurrent major depressive disorder, in partial remission [facility])   [place], Clearnce Sorrel, [place]       Future Appointments             In 3 weeks Brendolyn Patty, MD [place]   In 10 months Brendolyn Patty, MD [place]            Signed Prescriptions Disp Refills   venlafaxine XR (EFFEXOR-XR) 150 MG 24 hr capsule 90 capsule 0    Sig: TAKE 1 CAPSULE BY MOUTH EVERY DAY WITH BREAKFAST     Psychiatry: Antidepressants - SNRI - desvenlafaxine & venlafaxine Failed - 07/29/2021 10:26 AM      Failed - LDL in normal range and within 360 days    LDL Chol Calc (NIH)  Date Value Ref Range Status  05/19/2021 169 (H) 0 - 99 mg/dL Final          Failed - Total Cholesterol in normal range and within 360 days     Cholesterol, Total  Date Value Ref Range Status  05/19/2021 245 (H) 100 - 199 mg/dL Final          Failed - Last BP in normal range    BP Readings from Last 1 Encounters:  05/18/21 (!) 157/97          Passed - Triglycerides in normal range and within 360 days    Triglycerides  Date Value Ref Range Status  05/19/2021 46 0 - 149 mg/dL Final          Passed - Completed PHQ-2 or PHQ-9 in the last 360 days      Passed - Valid encounter within last 6 months    Recent Outpatient Visits           2 months ago Annual physical exam   [facility] Tally Joe T, FNP   3 months ago Recurrent major depressive disorder, in partial remission [facility])   [facility] Jerrol Banana., MD   5 months ago Recurrent major depressive disorder, in partial remission [facility])   [facility] Jerrol Banana., MD   1 year ago Hornet sting,  accidental or unintentional, initial encounter   [place], [place], [place]   1 year ago Recurrent major depressive disorder, in partial remission [facility])   [place], Clearnce Sorrel, [place]       Future Appointments             In 3 weeks Brendolyn Patty, MD [place]   In 10 months Brendolyn Patty, MD [place]

## 2021-08-21 ENCOUNTER — Ambulatory Visit: Payer: No Typology Code available for payment source | Admitting: Dermatology

## 2021-10-30 ENCOUNTER — Other Ambulatory Visit: Payer: Self-pay | Admitting: Family Medicine

## 2021-10-31 NOTE — Telephone Encounter (Signed)
Requested Prescriptions  ?Pending Prescriptions Disp Refills  ?? venlafaxine XR (EFFEXOR-XR) 150 MG 24 hr capsule [Pharmacy Med Name: VENLAFAXINE HCL ER 150 MG CAP] 90 capsule 0  ?  Sig: TAKE 1 CAPSULE BY MOUTH EVERY DAY WITH BREAKFAST  ?  ? Psychiatry: Antidepressants - SNRI - desvenlafaxine & venlafaxine Failed - 10/30/2021 11:27 AM  ?  ?  Failed - Cr in normal range and within 360 days  ?  Creatinine, Ser  ?Date Value Ref Range Status  ?10/20/2019 0.87 0.57 - 1.00 mg/dL Final  ?   ?  ?  Failed - Last BP in normal range  ?  BP Readings from Last 1 Encounters:  ?05/18/21 (!) 157/97  ?   ?  ?  Failed - Lipid Panel in normal range within the last 12 months  ?  Cholesterol, Total  ?Date Value Ref Range Status  ?05/19/2021 245 (H) 100 - 199 mg/dL Final  ? ?LDL Chol Calc (NIH)  ?Date Value Ref Range Status  ?05/19/2021 169 (H) 0 - 99 mg/dL Final  ? ?HDL  ?Date Value Ref Range Status  ?05/19/2021 69 >39 mg/dL Final  ? ?Triglycerides  ?Date Value Ref Range Status  ?05/19/2021 46 0 - 149 mg/dL Final  ? ?  ?  ?  Passed - Completed PHQ-2 or PHQ-9 in the last 360 days  ?  ?  Passed - Valid encounter within last 6 months  ?  Recent Outpatient Visits   ?      ? 5 months ago Annual physical exam  ? [facility] Gwyneth Sprout, FNP  ? 7 months ago Recurrent major depressive disorder, in partial remission ([place])  ? [facility] Jerrol Banana., MD  ? 9 months ago Recurrent major depressive disorder, in partial remission ([place])  ? [facility] Jerrol Banana., MD  ? 1 year ago Hornet sting, accidental or unintentional, initial encounter  ? [place], [place]  ? 2 years ago Recurrent major depressive disorder, in partial remission ([place])  ? [place], [place]  ?  ?  ?Future Appointments   ?        ? In 6 months Brendolyn Patty, MD [place]  ?  ? ?  ?  ?  ? ? ?

## 2022-01-18 ENCOUNTER — Other Ambulatory Visit: Payer: Self-pay | Admitting: Family Medicine

## 2022-01-18 DIAGNOSIS — E559 Vitamin D deficiency, unspecified: Secondary | ICD-10-CM

## 2022-01-18 NOTE — Telephone Encounter (Signed)
Requested medication (s) are due for refill today: yes  Requested medication (s) are on the active medication list: yes  Last refill:  07/31/21  Future visit scheduled: no  Notes to clinic:  Unable to refill per protocol due to failed labs, no updated results.     Requested Prescriptions  Pending Prescriptions Disp Refills   Vitamin D, Ergocalciferol, (DRISDOL) 1.25 MG (50000 UNIT) CAPS capsule [Pharmacy Med Name: VITAMIN D (ERGOCALCIFEROL) 1.25 MG] 12 capsule 1    Sig: TAKE 1 CAPSULE EVERY 7 DAYS     Endocrinology:  Vitamins - Vitamin D Supplementation 2 Failed - 01/18/2022  5:02 PM      Failed - Manual Review: Route requests for 50,000 IU strength to the provider      Failed - Ca in normal range and within 360 days    Calcium  Date Value Ref Range Status  10/20/2019 9.1 8.7 - 10.2 mg/dL Final         Failed - Vitamin D in normal range and within 360 days    Vit D, 25-Hydroxy  Date Value Ref Range Status  10/20/2019 56.0 30.0 - 100.0 ng/mL Final    Comment:    Vitamin D deficiency has been defined by the Institute of Medicine and an Endocrine Society practice guideline as a level of serum 25-OH vitamin D less than 20 ng/mL (1,2). The Endocrine Society went on to further define vitamin D insufficiency as a level between 21 and 29 ng/mL (2). 1. IOM (Institute of Medicine). 2010. Dietary reference    intakes for calcium and D. [place]: The    [employer]. 2. Holick MF, Binkley [place], Bischoff-Ferrari HA, et al.    Evaluation, treatment, and prevention of vitamin D    deficiency: an Endocrine Society clinical practice    guideline. JCEM. 2011 Jul; 96(7):1911-30.          Passed - Valid encounter within last 12 months    Recent Outpatient Visits           8 months ago Annual physical exam   [facility] Tally Joe T, FNP   9 months ago Recurrent major depressive disorder, in partial remission [facility])   [facility] Jerrol Banana., MD   11 months ago Recurrent major depressive disorder, in partial remission [facility])   [facility]   1 year ago Hornet sting, accidental or unintentional, initial encounter   [employer], PA-C   2 years ago Recurrent major depressive disorder, in partial remission [facility]   [place], Clearnce Sorrel, [place]       Future Appointments             In 4 months Brendolyn Patty, MD [place]

## 2022-02-13 ENCOUNTER — Other Ambulatory Visit: Payer: Self-pay | Admitting: Family Medicine

## 2022-03-14 ENCOUNTER — Encounter: Payer: Self-pay | Admitting: Family Medicine

## 2022-03-14 ENCOUNTER — Ambulatory Visit: Payer: No Typology Code available for payment source | Admitting: Family Medicine

## 2022-03-14 VITALS — BP 137/85 | HR 75 | Temp 97.8°F | Resp 16 | Ht 67.0 in | Wt 173.3 lb

## 2022-03-14 DIAGNOSIS — N644 Mastodynia: Secondary | ICD-10-CM | POA: Diagnosis not present

## 2022-03-14 DIAGNOSIS — N951 Menopausal and female climacteric states: Secondary | ICD-10-CM | POA: Diagnosis not present

## 2022-03-14 DIAGNOSIS — N920 Excessive and frequent menstruation with regular cycle: Secondary | ICD-10-CM | POA: Diagnosis not present

## 2022-03-14 NOTE — Progress Notes (Signed)
I,Sulibeya S Dimas,acting as a Education administrator for Gwyneth Sprout, FNP.,have documented all relevant documentation on the behalf of Gwyneth Sprout, FNP,as directed by  Gwyneth Sprout, FNP while in the presence of Gwyneth Sprout, FNP.  Established patient visit   Patient: Kristi Dominguez   DOB: 05-09-1966   56 y.o. Female  MRN: [id number] Visit Date: 03/14/2022  Today's healthcare provider: Gwyneth Sprout, FNP  RE Introduced to nurse practitioner role and practice setting.  All questions answered.  Discussed provider/patient relationship and expectations.  Chief Complaint  Patient presents with   Menopause   Subjective    HPI  Patient here today C/O heavy periods, breast tenderness, headaches, mood changes, and acne. Patient reports symptoms have been present for several months. Patient reports periods are monthly. Patient reports sweating a lot at night, waking up drenched in sweat.   Of note, pt with PMH of Depression with treatment, reporting loss of both parents within short period of time with complicated medical histories/conditions/complications including CA and CKD/iHD requiring transition to comfort care measures through hospice  Medications: Outpatient Medications Prior to Visit  Medication Sig   clobetasol cream (TEMOVATE) 0.05 % Apply topically to itchy rash on right elbow until improved. Avoid face, groin, axilla.   clobetasol cream (TEMOVATE) 9.79 % Apply 1 application topically 2 (two) times daily. Avoid applying to face, groin, and axilla. Use as directed. Long-term use can cause thinning of the skin.   Clobetasol Prop Emollient Base (CLOBETASOL PROPIONATE E) 0.05 % emollient cream Apply to affected area right arm as needed for itch twice daily. Avoid face, groin, underarms.   ondansetron (ZOFRAN) 4 MG tablet TAKE ONE TABLET EVERY EIGHT HOURS AS NEEDED   Ruxolitinib Phosphate (OPZELURA) 1.5 % CREA Apply 1 application topically in the morning and at bedtime.   SUMAtriptan (IMITREX)  100 MG tablet Take 1 tablet (100 mg total) by mouth every 2 (two) hours as needed for migraine. Do NOT take more than '200mg'$  in 24 hours   venlafaxine XR (EFFEXOR-XR) 150 MG 24 hr capsule TAKE 1 CAPSULE BY MOUTH EVERY DAY WITH BREAKFAST   Vitamin D, Ergocalciferol, (DRISDOL) 1.25 MG (50000 UNIT) CAPS capsule TAKE 1 CAPSULE EVERY 7 DAYS   No facility-administered medications prior to visit.   Review of Systems  Constitutional:  Positive for activity change, diaphoresis and fatigue. Negative for appetite change.  Respiratory:  Negative for chest tightness and shortness of breath.   Cardiovascular:  Negative for chest pain and palpitations.  Gastrointestinal:  Negative for abdominal pain, blood in stool, constipation, diarrhea, nausea and vomiting.  Endocrine: Positive for heat intolerance.  Genitourinary:  Positive for menstrual problem. Negative for dysuria, hematuria, vaginal discharge and vaginal pain.  Neurological:  Positive for headaches.  Psychiatric/Behavioral:  Positive for sleep disturbance.      Objective    BP 137/85 (BP Location: Left Arm, Patient Position: Sitting, Cuff Size: Large)   Pulse 75   Temp 97.8 F (36.6 C) (Oral)   Resp 16   Ht '5\' 7"'$  (1.702 m)   Wt 173 lb 4.8 oz (78.6 kg)   LMP 03/11/2022 (Exact Date)   BMI 27.14 kg/m    Physical Exam Vitals and nursing note reviewed.  Constitutional:      General: She is not in acute distress.    Appearance: Normal appearance. She is overweight. She is not ill-appearing, toxic-appearing or diaphoretic.  HENT:     Head: Normocephalic and atraumatic.  Cardiovascular:  Rate and Rhythm: Normal rate and regular rhythm.     Pulses: Normal pulses.     Heart sounds: Normal heart sounds. No murmur heard.    No friction rub. No gallop.  Pulmonary:     Effort: Pulmonary effort is normal. No respiratory distress.     Breath sounds: Normal breath sounds. No stridor. No wheezing, rhonchi or rales.  Chest:     Chest wall: No  tenderness.  Abdominal:     General: Bowel sounds are normal.     Palpations: Abdomen is soft.  Musculoskeletal:        General: No swelling, tenderness, deformity or signs of injury. Normal range of motion.     Right lower leg: No edema.     Left lower leg: No edema.  Skin:    General: Skin is warm and dry.     Capillary Refill: Capillary refill takes less than 2 seconds.     Coloration: Skin is not jaundiced or pale.     Findings: No bruising, erythema, lesion or rash.  Neurological:     General: No focal deficit present.     Mental Status: She is alert and oriented to person, place, and time. Mental status is at baseline.     Cranial Nerves: No cranial nerve deficit.     Sensory: No sensory deficit.     Motor: No weakness.     Coordination: Coordination normal.  Psychiatric:        Mood and Affect: Mood normal.        Behavior: Behavior normal.        Thought Content: Thought content normal.        Judgment: Judgment normal.    No results found for any visits on 03/14/22.  Assessment & Plan     Problem List Items Addressed This Visit       Cardiovascular and Mediastinum   Hot flashes due to menopause    Chronic, x 1 year Denies day time concerns Reports not having to change clothes/linens on bed Refer to GYN for further treatment with HRT- request for topical creams; denies complaints of vaginal dryness or painful intercourse      Relevant Orders   Ambulatory referral to Gynecology   FSH/LH     Other   Breast tenderness    Chronic, consistent x 6-12 months  Refer to GYN for further treatment with HRT- request for topical creams; denies complaints of vaginal dryness or painful intercourse      Relevant Orders   Ambulatory referral to Gynecology   FSH/LH   Menorrhagia with regular cycle - Primary    Chronic x18 months Refer to GYN for further treatment with HRT- request for topical creams; denies complaints of vaginal dryness or painful intercourse       Relevant Orders   Ambulatory referral to Gynecology   FSH/LH   Return if symptoms worsen or fail to improve.     Vonna Kotyk, FNP, have reviewed all documentation for this visit. The documentation on 03/14/22 for the exam, diagnosis, procedures, and orders are all accurate and complete.  Gwyneth Sprout, [place] [phone] (phone) [phone] (fax)  [place]

## 2022-03-14 NOTE — Assessment & Plan Note (Signed)
Chronic, consistent x 6-12 months  Refer to GYN for further treatment with HRT- request for topical creams; denies complaints of vaginal dryness or painful intercourse

## 2022-03-14 NOTE — Patient Instructions (Signed)
Alternative supplements:  Black Cohosh Tart Cherry Juice- as juice or in powder/capsules  OTC Treatment  Low dose paxil at bedtime

## 2022-03-14 NOTE — Assessment & Plan Note (Signed)
Chronic, x 1 year Denies day time concerns Reports not having to change clothes/linens on bed Refer to GYN for further treatment with HRT- request for topical creams; denies complaints of vaginal dryness or painful intercourse

## 2022-03-14 NOTE — Assessment & Plan Note (Signed)
Chronic x18 months Refer to GYN for further treatment with HRT- request for topical creams; denies complaints of vaginal dryness or painful intercourse

## 2022-03-15 LAB — FSH/LH
FSH: 13.1 m[IU]/mL
LH: 59.6 m[IU]/mL

## 2022-03-15 NOTE — Progress Notes (Signed)
Labs are normal and stable; continue to recommend f/u with GYN for HRT for symptoms.  Gwyneth Sprout, [address] [place], [place] [address] [phone] (phone) [phone] (fax) [place]

## 2022-05-01 ENCOUNTER — Other Ambulatory Visit: Payer: Self-pay | Admitting: Family Medicine

## 2022-05-01 DIAGNOSIS — E559 Vitamin D deficiency, unspecified: Secondary | ICD-10-CM

## 2022-05-01 DIAGNOSIS — G43009 Migraine without aura, not intractable, without status migrainosus: Secondary | ICD-10-CM

## 2022-05-01 NOTE — Telephone Encounter (Signed)
Requested Prescriptions  Pending Prescriptions Disp Refills  . SUMAtriptan (IMITREX) 100 MG tablet [Pharmacy Med Name: SUMATRIPTAN SUCCINATE 100 MG TAB] 10 tablet 2    Sig: TAKE 1 TABLET BY MOUTH EVERY 2 HOURS AS NEEDED FOR MIGRAINE. DO NOT TAKE MORE THAN 200 MG IN 24 HOURS.     Neurology:  Migraine Therapy - Triptan Passed - 05/01/2022 11:18 AM      Passed - Last BP in normal range    BP Readings from Last 1 Encounters:  03/14/22 137/85         Passed - Valid encounter within last 12 months    Recent Outpatient Visits          1 month ago Menorrhagia with regular cycle   [facility] Gwyneth Sprout, FNP   11 months ago Annual physical exam   [facility] Tally Joe T, FNP   1 year ago Recurrent major depressive disorder, in partial remission [facility])   [facility] Jerrol Banana., MD   1 year ago Recurrent major depressive disorder, in partial remission [facility])   [facility] Jerrol Banana., MD   2 years ago Hornet sting, accidental or unintentional, initial encounter   [facility] [place], Wendee Beavers, [place]      Future Appointments            In 2 weeks Gwyneth Sprout, [place], [place]   In 3 weeks Brendolyn Patty, [place]

## 2022-05-02 ENCOUNTER — Other Ambulatory Visit: Payer: Self-pay | Admitting: Family Medicine

## 2022-05-02 DIAGNOSIS — E559 Vitamin D deficiency, unspecified: Secondary | ICD-10-CM

## 2022-05-14 ENCOUNTER — Ambulatory Visit: Payer: No Typology Code available for payment source | Admitting: Dermatology

## 2022-05-17 NOTE — Progress Notes (Signed)
Complete physical exam   Patient: Kristi Dominguez   DOB: 02-22-1966   56 y.o. Female  MRN: [id number] Visit Date: 05/21/2022  Today's healthcare provider: Gwyneth Sprout, FNP  Re Introduced to nurse practitioner role and practice setting.  All questions answered.  Discussed provider/patient relationship and expectations.  I,Tiffany J Bragg,acting as a scribe for Gwyneth Sprout, FNP.,have documented all relevant documentation on the behalf of Gwyneth Sprout, FNP,as directed by  Gwyneth Sprout, FNP while in the presence of Gwyneth Sprout, FNP.   Chief Complaint  Patient presents with   Annual Exam   Subjective    Kristi Dominguez is a 56 y.o. female who presents today for a complete physical exam.  She reports consuming a general diet. Home exercise routine includes walking. She generally feels well. She reports sleeping fairly well. She does not have additional problems to discuss today.   HPI   Past Medical History:  Diagnosis Date   Depression    Dysplastic nevus 2004   pubic area - treated in [place]   Dysplastic nevus unsure of exact year   back, R hand, R foot, legs - treated in [place]   Dysplastic nevus 05/17/2020   L upper med thigh - mod   Dysplastic nevus 05/17/2020   R lat scapula - mod   Eczema    Past Surgical History:  Procedure Laterality Date   APPENDECTOMY  1996   COLONOSCOPY WITH PROPOFOL N/A 08/13/2018   Procedure: COLONOSCOPY WITH PROPOFOL;  Surgeon: Jonathon Bellows, MD;  Location: [facility] ENDOSCOPY;  Service: Gastroenterology;  Laterality: N/A;   TONSILLECTOMY  1990   VAGINAL DELIVERY  2004,2006,2008   Social History   Socioeconomic History   Marital status: Married    Spouse name: Not on file   Number of children: Not on file   Years of education: Not on file   Highest education level: Not on file  Occupational History   Not on file  Tobacco Use   Smoking status: Never   Smokeless tobacco: Never  Vaping Use   Vaping Use: Never used  Substance  and Sexual Activity   Alcohol use: No    Alcohol/week: 0.0 standard drinks of alcohol   Drug use: No   Sexual activity: Yes    Birth control/protection: None  Other Topics Concern   Not on file  Social History Narrative   Not on file   Social Determinants of Health   Financial Resource Strain: Not on file  Food Insecurity: Not on file  Transportation Needs: Not on file  Physical Activity: Not on file  Stress: Not on file  Social Connections: Not on file  Intimate Partner Violence: Not on file   Family Status  Relation Name Status   Mother  Alive   Father  Alive   [name]  (Not Specified)   Family History  Problem Relation Age of Onset   Kidney failure Mother    Hypertension Mother    Hyperlipidemia Mother    Heart attack Mother    Obesity Mother    Depression Mother    Heart attack Father    Hyperlipidemia Father    Hypertension Father    Depression Father    Breast cancer Paternal Aunt 41   Allergies  Allergen Reactions   Tetracycline Other (See Comments)    Patient Care Team: Gwyneth Sprout, FNP as PCP - General (Family Medicine)   Medications: Outpatient Medications Prior to Visit  Medication Sig   SUMAtriptan (IMITREX) 100 MG tablet TAKE 1 TABLET BY MOUTH EVERY 2 HOURS AS NEEDED FOR MIGRAINE. DO NOT TAKE MORE THAN 200 MG IN 24 HOURS.   Vitamin D, Ergocalciferol, (DRISDOL) 1.25 MG (50000 UNIT) CAPS capsule TAKE 1 CAPSULE EVERY 7 DAYS   [DISCONTINUED] clobetasol cream (TEMOVATE) 0.05 % Apply topically to itchy rash on right elbow until improved. Avoid face, groin, axilla.   [DISCONTINUED] clobetasol cream (TEMOVATE) 2.68 % Apply 1 application topically 2 (two) times daily. Avoid applying to face, groin, and axilla. Use as directed. Long-term use can cause thinning of the skin.   [DISCONTINUED] Clobetasol Prop Emollient Base (CLOBETASOL PROPIONATE E) 0.05 % emollient cream Apply to affected area right arm as needed for itch twice daily. Avoid face, groin,  underarms.   [DISCONTINUED] ondansetron (ZOFRAN) 4 MG tablet TAKE ONE TABLET EVERY EIGHT HOURS AS NEEDED   [DISCONTINUED] Ruxolitinib Phosphate (OPZELURA) 1.5 % CREA Apply 1 application topically in the morning and at bedtime.   [DISCONTINUED] venlafaxine XR (EFFEXOR-XR) 150 MG 24 hr capsule TAKE 1 CAPSULE BY MOUTH EVERY DAY WITH BREAKFAST   No facility-administered medications prior to visit.    Review of Systems  Constitutional:  Positive for unexpected weight change.  HENT:  Positive for congestion, postnasal drip, sinus pressure and sore throat.   Gastrointestinal:  Positive for constipation.  Musculoskeletal:  Positive for neck stiffness.  Neurological:  Positive for headaches.  Psychiatric/Behavioral:  Positive for decreased concentration and sleep disturbance. The patient is nervous/anxious.     Last CBC Lab Results  Component Value Date   WBC 3.6 10/20/2019   HGB 14.3 10/20/2019   HCT 42.2 10/20/2019   MCV 91 10/20/2019   MCH 30.9 10/20/2019   RDW 12.9 10/20/2019   PLT 237 34/19/6222   Last metabolic panel Lab Results  Component Value Date   GLUCOSE 99 10/20/2019   NA 138 10/20/2019   K 4.1 10/20/2019   CL 104 10/20/2019   CO2 23 10/20/2019   BUN 13 10/20/2019   CREATININE 0.87 10/20/2019   GFRNONAA 77 10/20/2019   CALCIUM 9.1 10/20/2019   PROT 6.5 10/20/2019   ALBUMIN 4.3 10/20/2019   LABGLOB 2.2 10/20/2019   AGRATIO 2.0 10/20/2019   BILITOT 0.2 10/20/2019   ALKPHOS 56 10/20/2019   AST 21 10/20/2019   ALT 17 10/20/2019   ANIONGAP 7 03/10/2018   Last lipids Lab Results  Component Value Date   CHOL 245 (H) 05/19/2021   HDL 69 05/19/2021   LDLCALC 169 (H) 05/19/2021   TRIG 46 05/19/2021   CHOLHDL 3.6 05/19/2021   Last hemoglobin A1c Lab Results  Component Value Date   HGBA1C 5.3 10/20/2019   Last thyroid functions Lab Results  Component Value Date   TSH 3.140 10/20/2019   Last vitamin D Lab Results  Component Value Date   VD25OH 56.0  10/20/2019      Objective    BP (!) 154/93 (BP Location: Right Arm, Patient Position: Sitting, Cuff Size: Normal)   Pulse 78   Resp 16   Ht '5\' 7"'$  (1.702 m)   Wt 177 lb (80.3 kg)   SpO2 99%   BMI 27.72 kg/m   BP Readings from Last 3 Encounters:  05/21/22 (!) 154/93  03/14/22 137/85  05/18/21 (!) 157/97   Wt Readings from Last 3 Encounters:  05/21/22 177 lb (80.3 kg)  03/14/22 173 lb 4.8 oz (78.6 kg)  05/18/21 159 lb (72.1 kg)   SpO2 Readings from Last  3 Encounters:  05/21/22 99%  05/18/21 100%  02/01/21 99%    Physical Exam Vitals and nursing note reviewed.  Constitutional:      General: She is awake. She is not in acute distress.    Appearance: Normal appearance. She is well-developed, well-groomed and overweight. She is not ill-appearing, toxic-appearing or diaphoretic.  HENT:     Head: Normocephalic and atraumatic.     Jaw: There is normal jaw occlusion. No trismus, tenderness, swelling or pain on movement.     Right Ear: Hearing, tympanic membrane, ear canal and external ear normal. There is no impacted cerumen.     Left Ear: Hearing, tympanic membrane, ear canal and external ear normal. There is no impacted cerumen.     Nose: Nose normal. No congestion or rhinorrhea.     Right Turbinates: Not enlarged, swollen or pale.     Left Turbinates: Not enlarged, swollen or pale.     Right Sinus: No maxillary sinus tenderness or frontal sinus tenderness.     Left Sinus: No maxillary sinus tenderness or frontal sinus tenderness.     Mouth/Throat:     Lips: Pink.     Mouth: Mucous membranes are moist. No injury.     Tongue: No lesions.     Pharynx: Oropharynx is clear. Uvula midline. No pharyngeal swelling, oropharyngeal exudate, posterior oropharyngeal erythema or uvula swelling.     Tonsils: No tonsillar exudate or tonsillar abscesses.  Eyes:     General: Lids are normal. Lids are everted, no foreign bodies appreciated. Vision grossly intact. Gaze aligned appropriately.  No allergic shiner or visual field deficit.       Right eye: No discharge.        Left eye: No discharge.     Extraocular Movements: Extraocular movements intact.     Conjunctiva/sclera: Conjunctivae normal.     Right eye: Right conjunctiva is not injected. No exudate.    Left eye: Left conjunctiva is not injected. No exudate.    Pupils: Pupils are equal, round, and reactive to light.  Neck:     Thyroid: No thyroid mass, thyromegaly or thyroid tenderness.     Vascular: No carotid bruit.     Trachea: Trachea normal.  Cardiovascular:     Rate and Rhythm: Normal rate and regular rhythm.     Pulses: Normal pulses.          Carotid pulses are 2+ on the right side and 2+ on the left side.      Radial pulses are 2+ on the right side and 2+ on the left side.       Dorsalis pedis pulses are 2+ on the right side and 2+ on the left side.       Posterior tibial pulses are 2+ on the right side and 2+ on the left side.     Heart sounds: Normal heart sounds, S1 normal and S2 normal. No murmur heard.    No friction rub. No gallop.  Pulmonary:     Effort: Pulmonary effort is normal. No respiratory distress.     Breath sounds: Normal breath sounds and air entry. No stridor. No wheezing, rhonchi or rales.  Chest:     Chest wall: No tenderness.  Abdominal:     General: Abdomen is flat. Bowel sounds are normal. There is no distension.     Palpations: Abdomen is soft. There is no mass.     Tenderness: There is no abdominal tenderness. There is no right CVA tenderness,  left CVA tenderness, guarding or rebound.     Hernia: No hernia is present.  Genitourinary:    Comments: Exam deferred; denies complaints Musculoskeletal:        General: No swelling, tenderness, deformity or signs of injury. Normal range of motion.     Cervical back: Full passive range of motion without pain, normal range of motion and neck supple. No edema, rigidity or tenderness. No muscular tenderness.     Right lower leg: No edema.      Left lower leg: No edema.  Lymphadenopathy:     Cervical: No cervical adenopathy.     Right cervical: No superficial, deep or posterior cervical adenopathy.    Left cervical: No superficial, deep or posterior cervical adenopathy.  Skin:    General: Skin is warm and dry.     Capillary Refill: Capillary refill takes less than 2 seconds.     Coloration: Skin is not jaundiced or pale.     Findings: No bruising, erythema, lesion or rash.  Neurological:     General: No focal deficit present.     Mental Status: She is alert and oriented to person, place, and time. Mental status is at baseline.     GCS: GCS eye subscore is 4. GCS verbal subscore is 5. GCS motor subscore is 6.     Sensory: Sensation is intact. No sensory deficit.     Motor: Motor function is intact. No weakness.     Coordination: Coordination is intact. Coordination normal.     Gait: Gait is intact. Gait normal.  Psychiatric:        Attention and Perception: Attention and perception normal.        Mood and Affect: Mood and affect normal.        Speech: Speech normal.        Behavior: Behavior normal. Behavior is cooperative.        Thought Content: Thought content normal.        Cognition and Memory: Cognition and memory normal.        Judgment: Judgment normal.     Last depression screening scores    05/21/2022   10:09 AM 03/14/2022   11:07 AM 05/18/2021   10:22 AM  PHQ 2/9 Scores  PHQ - 2 Score '2 2 2  '$ PHQ- 9 Score '9 7 7   '$ Last fall risk screening    05/21/2022   10:09 AM  Fall Risk   Falls in the past year? 0  Number falls in past yr: 0  Injury with Fall? 0  Risk for fall due to : No Fall Risks  Follow up Falls evaluation completed   Last Audit-C alcohol use screening    05/21/2022   10:09 AM  Alcohol Use Disorder Test (AUDIT)  1. How often do you have a drink containing alcohol? 0  2. How many drinks containing alcohol do you have on a typical day when you are drinking? 0  3. How often do you  have six or more drinks on one occasion? 0  AUDIT-C Score 0   A score of 3 or more in women, and 4 or more in men indicates increased risk for alcohol abuse, EXCEPT if all of the points are from question 1   No results found for any visits on 05/21/22.  Assessment & Plan    Routine Health Maintenance and Physical Exam  Exercise Activities and Dietary recommendations  Goals   None     Immunization History  Administered Date(s) Administered   Influenza,inj,Quad PF,6+ Mos 04/06/2014, 05/18/2015, 05/22/2016, 07/09/2018   Tdap 10/09/2011, 05/22/2016    Health Maintenance  Topic Date Due   Zoster Vaccines- Shingrix (1 of 2) 08/21/2022 (Originally [birth year]/13/1986)   INFLUENZA VACCINE  10/21/2022 (Originally 02/20/2022)   Hepatitis C Screening  05/22/2023 (Originally [birth year]13/1985)   MAMMOGRAM  09/06/2022   PAP SMEAR-Modifier  05/18/2026   TETANUS/TDAP  05/22/2026   COLONOSCOPY (Pts 45-69yr Insurance coverage will need to be confirmed)  08/13/2028   HIV Screening  Completed   HPV VACCINES  Aged Out    Discussed health benefits of physical activity, and encouraged her to engage in regular exercise appropriate for her age and condition.  Problem List Items Addressed This Visit       Other   Annual physical exam - Primary    UTD on dental Went to eye doctor last year; wears eye glasses for night vision when driving and reading glasses Planning on [place] vacation with children and husband and his sister this holiday season Things to do to keep yourself healthy  - Exercise at least 30-45 minutes a day, 3-4 days a week.  - Eat a low-fat diet with lots of fruits and vegetables, up to 7-9 servings per day.  - Seatbelts can save your life. Wear them always.  - Smoke detectors on every level of your home, check batteries every year.  - Eye Doctor - have an eye exam every 1-2 years  - Safe sex - if you may be exposed to STDs, use a condom.  - Alcohol -  If you drink, do it moderately, less  than 2 drinks per day.  - [place] Choose someone to speak for you if you are not able.  - Depression is common in our stressful world.If you're feeling down or losing interest in things you normally enjoy, please come in for a visit.  - Violence - If anyone is threatening or hurting you, please call immediately.        Relevant Orders   CBC with Differential/Platelet   Comprehensive Metabolic Panel (CMET)   Lipid panel   TSH + free T4   Avitaminosis D    Chronic, previously on supplement Repeat labs given chronic depression and use of medication to supplement PHQ9 slightly elevated from previously      Relevant Orders   Vitamin D (25 hydroxy)   Elevated blood pressure reading in office with white coat syndrome, without diagnosis of hypertension    Acute on chronic, has thought about changing PCP given memories of bringing her ailing father to this practice Encourage home monitoring Handout provided Previously well controlled at visit in 8/23 Goal <140/<90       Recurrent major depressive disorder, in partial remission ([place]    Chronic, recurrent Triggered by environment s/s father's illness Declines change in medication at this time given concern for 'over medication' Continue on effexor 150 mg       Relevant Medications   venlafaxine XR (EFFEXOR-XR) 150 MG 24 hr capsule   Screening for diabetes mellitus    Patient reports extensive DM risk throughout family; notes +weight changes Repeat A1c Continue to recommend balanced, lower carb meals. Smaller meal size, adding snacks. Choosing water as drink of choice and increasing purposeful exercise.       Relevant Orders   Hemoglobin A1c   Screening mammogram for breast cancer    Due for screening for mammogram, denies breast concerns, provided with phone  number to call and schedule appointment for mammogram. Encouraged to repeat breast cancer screening every 1-2 years.       Relevant Orders   MM  3D SCREEN BREAST BILATERAL   Return in about 1 year (around 05/22/2023) for annual examination.    Vonna Kotyk, FNP, have reviewed all documentation for this visit. The documentation on 05/21/22 for the exam, diagnosis, procedures, and orders are all accurate and complete.  Gwyneth Sprout, [place] [phone] (phone) [phone] (fax)  [place]

## 2022-05-21 ENCOUNTER — Ambulatory Visit (INDEPENDENT_AMBULATORY_CARE_PROVIDER_SITE_OTHER): Payer: No Typology Code available for payment source | Admitting: Family Medicine

## 2022-05-21 ENCOUNTER — Encounter: Payer: Self-pay | Admitting: Family Medicine

## 2022-05-21 VITALS — BP 154/93 | HR 78 | Resp 16 | Ht 67.0 in | Wt 177.0 lb

## 2022-05-21 DIAGNOSIS — F3341 Major depressive disorder, recurrent, in partial remission: Secondary | ICD-10-CM

## 2022-05-21 DIAGNOSIS — E559 Vitamin D deficiency, unspecified: Secondary | ICD-10-CM | POA: Diagnosis not present

## 2022-05-21 DIAGNOSIS — R635 Abnormal weight gain: Secondary | ICD-10-CM | POA: Insufficient documentation

## 2022-05-21 DIAGNOSIS — Z0000 Encounter for general adult medical examination without abnormal findings: Secondary | ICD-10-CM

## 2022-05-21 DIAGNOSIS — R030 Elevated blood-pressure reading, without diagnosis of hypertension: Secondary | ICD-10-CM

## 2022-05-21 DIAGNOSIS — Z1231 Encounter for screening mammogram for malignant neoplasm of breast: Secondary | ICD-10-CM | POA: Insufficient documentation

## 2022-05-21 DIAGNOSIS — Z131 Encounter for screening for diabetes mellitus: Secondary | ICD-10-CM | POA: Diagnosis not present

## 2022-05-21 MED ORDER — VENLAFAXINE HCL ER 150 MG PO CP24: ORAL_CAPSULE | ORAL | 3 refills | Status: DC

## 2022-05-21 NOTE — Assessment & Plan Note (Signed)
Due for screening for mammogram, denies breast concerns, provided with phone number to call and schedule appointment for mammogram. Encouraged to repeat breast cancer screening every 1-2 years.  

## 2022-05-21 NOTE — Assessment & Plan Note (Signed)
Acute on chronic, has thought about changing PCP given memories of bringing her ailing father to this practice Encourage home monitoring Handout provided Previously well controlled at visit in 8/23 Goal <140/<90

## 2022-05-21 NOTE — Assessment & Plan Note (Signed)
Chronic, previously on supplement Repeat labs given chronic depression and use of medication to supplement PHQ9 slightly elevated from previously

## 2022-05-21 NOTE — Patient Instructions (Signed)
Please call and schedule your mammogram:  [facility]  [address], [address] [facility] [place],  [place]  [address] Get Driving Directions Main: [phone]  Sunday:Closed Monday:7:20 AM - 5:00 PM Tuesday:7:20 AM - 5:00 PM Wednesday:7:20 AM - 5:00 PM Thursday:7:20 AM - 5:00 PM Friday:7:20 AM - 4:30 PM Saturday:Closed  

## 2022-05-21 NOTE — Assessment & Plan Note (Signed)
Patient reports extensive DM risk throughout family; notes +weight changes Repeat A1c Continue to recommend balanced, lower carb meals. Smaller meal size, adding snacks. Choosing water as drink of choice and increasing purposeful exercise.

## 2022-05-21 NOTE — Assessment & Plan Note (Signed)
Chronic, recurrent Triggered by environment s/s father's illness Declines change in medication at this time given concern for 'over medication' Continue on effexor 150 mg

## 2022-05-21 NOTE — Assessment & Plan Note (Signed)
UTD on dental Went to eye doctor last year; wears eye glasses for night vision when driving and reading glasses Planning on [place] vacation with children and husband and his sister this holiday season Things to do to keep yourself healthy  - Exercise at least 30-45 minutes a day, 3-4 days a week.  - Eat a low-fat diet with lots of fruits and vegetables, up to 7-9 servings per day.  - Seatbelts can save your life. Wear them always.  - Smoke detectors on every level of your home, check batteries every year.  - Eye Doctor - have an eye exam every 1-2 years  - Safe sex - if you may be exposed to STDs, use a condom.  - Alcohol -  If you drink, do it moderately, less than 2 drinks per day.  - [place]. Choose someone to speak for you if you are not able.  - Depression is common in our stressful world.If you're feeling down or losing interest in things you normally enjoy, please come in for a visit.  - Violence - If anyone is threatening or hurting you, please call immediately.

## 2022-05-22 LAB — COMPREHENSIVE METABOLIC PANEL
ALT: 9 IU/L (ref 0–32)
AST: 12 IU/L (ref 0–40)
Albumin/Globulin Ratio: 2 (ref 1.2–2.2)
Albumin: 4.3 g/dL (ref 3.8–4.9)
Alkaline Phosphatase: 62 IU/L (ref 44–121)
BUN/Creatinine Ratio: 23 (ref 9–23)
BUN: 18 mg/dL (ref 6–24)
Bilirubin Total: 0.2 mg/dL (ref 0.0–1.2)
CO2: 23 mmol/L (ref 20–29)
Calcium: 9 mg/dL (ref 8.7–10.2)
Chloride: 106 mmol/L (ref 96–106)
Creatinine, Ser: 0.77 mg/dL (ref 0.57–1.00)
Globulin, Total: 2.2 g/dL (ref 1.5–4.5)
Glucose: 105 mg/dL — ABNORMAL HIGH (ref 70–99)
Potassium: 5 mmol/L (ref 3.5–5.2)
Sodium: 141 mmol/L (ref 134–144)
Total Protein: 6.5 g/dL (ref 6.0–8.5)
eGFR: 91 mL/min/{1.73_m2} (ref >59)

## 2022-05-22 LAB — LIPID PANEL
Chol/HDL Ratio: 3.4 ratio (ref 0.0–4.4)
Cholesterol, Total: 268 mg/dL — ABNORMAL HIGH (ref 100–199)
HDL: 79 mg/dL (ref >39)
LDL Chol Calc (NIH): 167 mg/dL — ABNORMAL HIGH (ref 0–99)
Triglycerides: 124 mg/dL (ref 0–149)
VLDL Cholesterol Cal: 22 mg/dL (ref 5–40)

## 2022-05-22 LAB — CBC WITH DIFFERENTIAL/PLATELET
Basophils Absolute: 0 10*3/uL (ref 0.0–0.2)
Basos: 0 %
EOS (ABSOLUTE): 0.2 10*3/uL (ref 0.0–0.4)
Eos: 4 %
Hematocrit: 44.8 % (ref 34.0–46.6)
Hemoglobin: 14.8 g/dL (ref 11.1–15.9)
Immature Grans (Abs): 0 10*3/uL (ref 0.0–0.1)
Immature Granulocytes: 0 %
Lymphocytes Absolute: 1.5 10*3/uL (ref 0.7–3.1)
Lymphs: 29 %
MCH: 30.9 pg (ref 26.6–33.0)
MCHC: 33 g/dL (ref 31.5–35.7)
MCV: 94 fL (ref 79–97)
Monocytes Absolute: 0.4 10*3/uL (ref 0.1–0.9)
Monocytes: 8 %
Neutrophils Absolute: 3.1 10*3/uL (ref 1.4–7.0)
Neutrophils: 59 %
Platelets: 246 10*3/uL (ref 150–450)
RBC: 4.79 x10E6/uL (ref 3.77–5.28)
RDW: 12.9 % (ref 11.7–15.4)
WBC: 5.2 10*3/uL (ref 3.4–10.8)

## 2022-05-22 LAB — TSH+FREE T4
Free T4: 0.98 ng/dL (ref 0.82–1.77)
TSH: 2.06 u[IU]/mL (ref 0.450–4.500)

## 2022-05-22 LAB — HEMOGLOBIN A1C
Est. average glucose Bld gHb Est-mCnc: 114 mg/dL
Hgb A1c MFr Bld: 5.6 % (ref 4.8–5.6)

## 2022-05-22 LAB — VITAMIN D 25 HYDROXY (VIT D DEFICIENCY, FRACTURES): Vit D, 25-Hydroxy: 44.6 ng/mL (ref 30.0–100.0)

## 2022-05-23 NOTE — Progress Notes (Signed)
Cholesterol remains elevated- both total and LDL/bad cholesterol. I continue to recommend diet low in saturated fat and regular exercise - 30 min at least 5 times per week despite low stroke risk at 3% in 10 years estimated.  The 10-year ASCVD risk score (Arnett DK, et al., 2019) is: 2.7%   Values used to calculate the score:     Age: 56 years     Sex: Female     Is Non-Hispanic African American: No     Diabetic: No     Tobacco smoker: No     Systolic Blood Pressure: 939 mmHg     Is BP treated: No     HDL Cholesterol: 79 mg/dL     Total Cholesterol: 268 mg/dL  All other labs are normal and stable.  Gwyneth Sprout, [address] [place], [place] [address] [phone] (phone) [phone] (fax) [place]

## 2022-05-28 ENCOUNTER — Ambulatory Visit: Payer: No Typology Code available for payment source | Admitting: Dermatology

## 2022-05-28 DIAGNOSIS — L821 Other seborrheic keratosis: Secondary | ICD-10-CM

## 2022-05-28 DIAGNOSIS — L814 Other melanin hyperpigmentation: Secondary | ICD-10-CM

## 2022-05-28 DIAGNOSIS — L700 Acne vulgaris: Secondary | ICD-10-CM

## 2022-05-28 DIAGNOSIS — L578 Other skin changes due to chronic exposure to nonionizing radiation: Secondary | ICD-10-CM

## 2022-05-28 DIAGNOSIS — D2262 Melanocytic nevi of left upper limb, including shoulder: Secondary | ICD-10-CM | POA: Diagnosis not present

## 2022-05-28 DIAGNOSIS — D225 Melanocytic nevi of trunk: Secondary | ICD-10-CM

## 2022-05-28 DIAGNOSIS — L304 Erythema intertrigo: Secondary | ICD-10-CM

## 2022-05-28 DIAGNOSIS — L280 Lichen simplex chronicus: Secondary | ICD-10-CM | POA: Diagnosis not present

## 2022-05-28 DIAGNOSIS — Z86018 Personal history of other benign neoplasm: Secondary | ICD-10-CM

## 2022-05-28 DIAGNOSIS — Z1283 Encounter for screening for malignant neoplasm of skin: Secondary | ICD-10-CM

## 2022-05-28 DIAGNOSIS — D229 Melanocytic nevi, unspecified: Secondary | ICD-10-CM

## 2022-05-28 MED ORDER — OPZELURA 1.5 % EX CREA: TOPICAL_CREAM | CUTANEOUS | 3 refills | Status: DC

## 2022-05-28 MED ORDER — KETOCONAZOLE 2 % EX CREA: TOPICAL_CREAM | CUTANEOUS | 0 refills | Status: DC

## 2022-05-28 MED ORDER — CLOBETASOL PROPIONATE 0.05 % EX CREA: TOPICAL_CREAM | CUTANEOUS | 0 refills | Status: DC

## 2022-05-28 NOTE — Progress Notes (Signed)
Follow-Up Visit   Subjective  Kristi Dominguez is a 56 y.o. female who presents for the following: Annual Exam (Hx dysplastic nevi ). The patient presents for Total-Body Skin Exam (TBSE) for skin cancer screening and mole check.  The patient has spots, moles and lesions to be evaluated, some may be new or changing and the patient has concerns that these could be cancer. She has rash under breast to check.  She also has  The following portions of the chart were reviewed this encounter and updated as appropriate:       Review of Systems:  No other skin or systemic complaints except as noted in HPI or Assessment and Plan.  Objective  Well appearing patient in no apparent distress; mood and affect are within normal limits.  A full examination was performed including scalp, head, eyes, ears, nose, lips, neck, chest, axillae, abdomen, back, buttocks, bilateral upper extremities, bilateral lower extremities, hands, feet, fingers, toes, fingernails, and toenails. All findings within normal limits unless otherwise noted below.  R elbow Clear. Mild erythema R forearm  L upper arm; L abdomen; Left Lower Back  Left Lower Back 0.4 x 0.25 cm regular medium dark brown macule.  L upper arm 0.4 cm medium brown macule.   L abdomen 0.35 cm medium dark brown macule   Face Few scattered inflammatory papules on the forehead, perioral, chin, and cheeks.   Right Inframammary Fold scattered small pink papules.    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 Condition is symptomatic / bothersome to patient. Improving but not to goal.  Continue Opzelura cream to aa QD PRN and Clobetasol 0.05% cream to aa QD PRN when Opzelura doesn't control condition.  Topical steroids (such as triamcinolone, fluocinolone, fluocinonide, mometasone, clobetasol, halobetasol, betamethasone, hydrocortisone)  can cause thinning and lightening of the skin if they are used for too long in the same area. Your physician has selected the right strength medicine for your problem and area affected on the body. Please use your medication only as directed by your physician to prevent side effects.    Ruxolitinib Phosphate (OPZELURA) 1.5 % CREA - R elbow Apply to itchy area on R arm QD PRN.  clobetasol cream (TEMOVATE) 0.05 % - R elbow Apply to itchy area on R arm QD PRN. Avoid applying to face, groin, and axilla. Use as directed. Long-term use can cause thinning of the skin.  Nevus (3) L upper arm; L abdomen; Left Lower Back  Reviewed photos from 2021 -   Benign-appearing. Stable compared to previous visit. Observation.  Call clinic for new or changing moles.  Recommend daily use of broad spectrum spf 30+ sunscreen to sun-exposed areas.    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  Samples given of La Roche Posay adapalene gel QHS.   Erythema intertrigo Right Inframammary Fold  Intertrigo is a chronic recurrent rash that occurs in skin fold areas that may be associated with friction; heat; moisture; yeast; fungus; and bacteria.  It is exacerbated by increased movement / activity; sweating; and higher atmospheric temperature.  Start Ketoconazole 2% cream to aa's Qhs and OTC Zeasorb AF powder daily.   ketoconazole (NIZORAL) 2 % cream - Right Inframammary Fold Apply to aa's QD PRN.   Lentigines - Scattered tan macules - Due to sun exposure - Benign-appearing,  observe - Recommend daily broad spectrum sunscreen SPF 30+ to sun-exposed areas, reapply every 2 hours as needed. - Call for any changes  Seborrheic Keratoses - Stuck-on, waxy, tan-brown papules and/or plaques  - Benign-appearing - Discussed benign etiology and prognosis. - Observe - Call for any changes  Melanocytic Nevi - Tan-brown and/or  pink-flesh-colored symmetric macules and papules - Benign appearing on exam today - Observation - Call clinic for new or changing moles - Recommend daily use of broad spectrum spf 30+ sunscreen to sun-exposed areas.   Hemangiomas - Red papules - Discussed benign nature - Observe - Call for any changes  Actinic Damage - Chronic condition, secondary to cumulative UV/sun exposure - diffuse scaly erythematous macules with underlying dyspigmentation - Recommend daily broad spectrum sunscreen SPF 30+ to sun-exposed areas, reapply every 2 hours as needed.  - Staying in the shade or wearing long sleeves, sun glasses (UVA+UVB protection) and wide brim hats (4-inch brim around the entire circumference of the hat) are also recommended for sun protection.  - Call for new or changing lesions.  History of Dysplastic Nevi - No evidence of recurrence today - Recommend regular full body skin exams - Recommend daily broad spectrum sunscreen SPF 30+ to sun-exposed areas, reapply every 2 hours as needed.  - Call if any new or changing lesions are noted between office visits  Skin cancer screening performed today.  Return in about 1 year (around 05/29/2023) for TBSE.  Luther Redo, CMA, am acting as scribe for Brendolyn Patty, MD .  Documentation: I have reviewed the above documentation for accuracy and completeness, and I agree with the above.  Brendolyn Patty MD

## 2022-05-28 NOTE — Patient Instructions (Addendum)
Recommend Zeasorb AF powder daily to prevent recurrence.   Melanoma ABCDEs  Melanoma is the most dangerous type of skin cancer, and is the leading cause of death from skin disease.  You are more likely to develop melanoma if you: Have light-colored skin, light-colored eyes, or red or blond hair Spend a lot of time in the sun Tan regularly, either outdoors or in a tanning bed Have had blistering sunburns, especially during childhood Have a close family member who has had a melanoma Have atypical moles or large birthmarks  Early detection of melanoma is key since treatment is typically straightforward and cure rates are extremely high if we catch it early.   The first sign of melanoma is often a change in a mole or a new dark spot.  The ABCDE system is a way of remembering the signs of melanoma.  A for asymmetry:  The two halves do not match. B for border:  The edges of the growth are irregular. C for color:  A mixture of colors are present instead of an even brown color. D for diameter:  Melanomas are usually (but not always) greater than 24m - the size of a pencil eraser. E for evolution:  The spot keeps changing in size, shape, and color.  Please check your skin once per month between visits. You can use a small mirror in front and a large mirror behind you to keep an eye on the back side or your body.   If you see any new or changing lesions before your next follow-up, please call to schedule a visit.  Please continue daily skin protection including broad spectrum sunscreen SPF 30+ to sun-exposed areas, reapplying every 2 hours as needed when you're outdoors.   Staying in the shade or wearing long sleeves, sun glasses (UVA+UVB protection) and wide brim hats (4-inch brim around the entire circumference of the hat) are also recommended for sun protection.      Due to recent changes in healthcare laws, you may see results of your pathology and/or laboratory studies on MyChart before  the doctors have had a chance to review them. We understand that in some cases there may be results that are confusing or concerning to you. Please understand that not all results are received at the same time and often the doctors may need to interpret multiple results in order to provide you with the best plan of care or course of treatment. Therefore, we ask that you please give u[place]2 business days to thoroughly review all your results before contacting the office for clarification. Should we see a critical lab result, you will be contacted sooner.   If You Need Anything After Your Visit  If you have any questions or concerns for your doctor, please call our main line at [phone] press option 4 to reach your doctor's medical assistant. If no one answers, please leave a voicemail as directed and we will return your call as soon as possible. Messages left after 4 pm will be answered the following business day.   You may also send [place] message via [facility] We typically respond to MyChart messages within 1-2 business days.  For prescription refills, please ask your pharmacy to contact our office. Our fax number is [phone]  If you have an urgent issue when the clinic is closed that cannot wait until the next business day, you can page your doctor at the number below.    Please note that while we do our  best to be available for urgent issues outside of office hours, we are not available 24/7.   If you have an urgent issue and are unable to reach [place], you may choose to seek medical care at your doctor's office, retail clinic, urgent care center, or emergency room.  If you have a medical emergency, please immediately call 911 or go to the emergency department.  Pager Numbers  - Dr. Nehemiah Massed: [phone]  - Dr. Laurence Ferrari: [phone]  - Dr. Nicole Kindred: [phone]  In the event of inclement weather, please call our main line at [phone] for an update on the status of any delays or  closures.  Dermatology Medication Tips: Please keep the boxes that topical medications come in in order to help keep track of the instructions about where and how to use these. Pharmacies typically print the medication instructions only on the boxes and not directly on the medication tubes.   If your medication is too expensive, please contact our office at [phone] option 4 or send [place] a message through [place].   We are unable to tell what your co-pay for medications will be in advance as this is different depending on your insurance coverage. However, we may be able to find a substitute medication at lower cost or fill out paperwork to get insurance to cover a needed medication.   If a prior authorization is required to get your medication covered by your insurance company, please allow [place] 1-2 business days to complete this process.  Drug prices often vary depending on where the prescription is filled and some pharmacies may offer cheaper prices.  The website www.goodrx.com contains coupons for medications through different pharmacies. The prices here do not account for what the cost may be with help from insurance (it may be cheaper with your insurance), but the website can give you the price if you did not use any insurance.  - You can print the associated coupon and take it with your prescription to the pharmacy.  - You may also stop by our office during regular business hours and pick up a GoodRx coupon card.  - If you need your prescription sent electronically to a different pharmacy, notify our office through [facility] or by phone at [phone] option 4.     Si Usted Necesita Algo Despus de Su Visita  Tambin puede enviarnos un mensaje a travs de Pharmacist, community. Por lo general respondemos a los mensajes de MyChart en el transcurso de 1 a 2 das hbiles.  Para renovar recetas, por favor pida a su farmacia que se ponga en contacto con nuestra oficina. Harland Dingwall de fax  es [place] [phone].  Si tiene un asunto urgente cuando la clnica est cerrada y que no puede esperar hasta el siguiente da hbil, puede llamar/localizar a su doctor(a) al nmero que aparece a continuacin.   Por favor, tenga en cuenta que aunque hacemos todo lo posible para estar disponibles para asuntos urgentes fuera del horario de [place], no estamos disponibles las 24 horas del da, los 7 das de la [place].   Si tiene un problema urgente y no puede comunicarse con nosotros, puede optar por buscar atencin mdica  en el consultorio de su doctor(a), en una clnica privada, en un centro de atencin urgente o en una sala de emergencias.  Si tiene Engineering geologist, por favor llame inmediatamente al 911 o vaya a la sala de emergencias.  Nmeros de bper  - Dr. Nehemiah Massed: [phone]  - Dra. Moye: [phone]  -  Eliberto IvoryEB:[id number]  En caso de inclemencias del [place], por favor llame a nuestra lnea principal al [phone] para una actualizacin sobre el [place] de cualquier retraso o cierre.  Consejos para la medicacin en dermatologa: Por favor, guarde las cajas en las que vienen los medicamentos de uso tpico para ayudarle a seguir las instrucciones sobre dnde y cmo usarlos. Las farmacias generalmente imprimen las instrucciones del medicamento slo en las cajas y no directamente en los tubos del [place].   Si su medicamento es muy caro, por favor, pngase en contacto con Zigmund Daniel llamando al [phone] y presione la opcin 4 o envenos un mensaje a travs de Pharmacist, community.   No podemos decirle cul ser su copago por los medicamentos por adelantado ya que esto es diferente dependiendo de la cobertura de su seguro. Sin embargo, es posible que podamos encontrar un medicamento sustituto a Electrical engineer un formulario para que el seguro cubra el medicamento que se considera necesario.   Si se requiere una autorizacin previa para que su compaa de seguros [place]  su medicamento, por favor permtanos de 1 a 2 das hbiles para completar este proceso.  Los precios de los medicamentos varan con frecuencia dependiendo del Environmental consultant de dnde se surte la receta y alguna farmacias pueden ofrecer precios ms baratos.  El sitio web www.goodrx.com tiene cupones para medicamentos de Airline pilot. Los precios aqu no tienen en cuenta lo que podra costar con la ayuda del seguro (puede ser ms barato con su seguro), pero el sitio web puede darle el precio si no utiliz Research scientist (physical sciences).  - Puede imprimir el cupn correspondiente y llevarlo con su receta a la farmacia.  - Tambin puede pasar por nuestra oficina durante el horario de atencin regular y Charity fundraiser una tarjeta de cupones de GoodRx.  - Si necesita que su receta se enve electrnicamente a una farmacia diferente, informe a nuestra oficina a travs de MyChart de [place] o por telfono llamando al [phone] y presione la opcin 4.

## 2022-08-17 ENCOUNTER — Ambulatory Visit
Admission: RE | Admit: 2022-08-17 | Discharge: 2022-08-17 | Disposition: A | Discharge status: Home / Self Care | Payer: 59 | Source: Ambulatory Visit | Attending: Family Medicine | Admitting: Family Medicine

## 2022-08-17 DIAGNOSIS — Z1231 Encounter for screening mammogram for malignant neoplasm of breast: Secondary | ICD-10-CM | POA: Diagnosis present

## 2022-08-20 NOTE — Progress Notes (Signed)
Hi Kristi Dominguez  Normal mammogram; repeat in 1 year.  Please let us know if you have any questions.  Thank you,  Tally Joe, FNP

## 2023-02-11 ENCOUNTER — Other Ambulatory Visit: Payer: Self-pay | Admitting: Family Medicine

## 2023-02-11 DIAGNOSIS — F3341 Major depressive disorder, recurrent, in partial remission: Secondary | ICD-10-CM

## 2023-02-12 NOTE — Telephone Encounter (Signed)
Requested Prescriptions  Refused Prescriptions Disp Refills   venlafaxine XR (EFFEXOR-XR) 150 MG 24 hr capsule [Pharmacy Med Name: VENLAFAXINE HCL ER 150 MG CAP] 90 capsule 3    Sig: TAKE 1 CAPSULE BY MOUTH EVERY DAY WITH BREAKFAST     Psychiatry: Antidepressants - SNRI - desvenlafaxine & venlafaxine Failed - 02/11/2023 10:03 AM      Failed - Last BP in normal range    BP Readings from Last 1 Encounters:  05/21/22 (!) 154/93         Failed - Valid encounter within last 6 months    Recent Outpatient Visits           8 months ago Annual physical exam   [facility] Merita Norton T, FNP   11 months ago Menorrhagia with regular cycle   [facility] Merita Norton T, FNP   1 year ago Annual physical exam   [facility] Merita Norton T, FNP   1 year ago Recurrent major depressive disorder, in partial remission [facility])   [facility] Bosie Clos, MD   2 years ago Recurrent major depressive disorder, in partial remission [facility])   [facility] Bosie Clos, MD              Failed - Lipid Panel in normal range within the last 12 months    Cholesterol, Total  Date Value Ref Range Status  05/21/2022 268 (H) 100 - 199 mg/dL Final   LDL Chol Calc (NIH)  Date Value Ref Range Status  05/21/2022 167 (H) 0 - 99 mg/dL Final   HDL  Date Value Ref Range Status  05/21/2022 79 >39 mg/dL Final   Triglycerides  Date Value Ref Range Status  05/21/2022 124 0 - 149 mg/dL Final         Passed - Cr in normal range and within 360 days    Creatinine, Ser  Date Value Ref Range Status  05/21/2022 0.77 0.57 - 1.00 mg/dL Final         Passed - Completed PHQ-2 or PHQ-9 in the last 360 days

## 2023-05-09 ENCOUNTER — Ambulatory Visit: Payer: 59 | Admitting: Family Medicine

## 2023-05-09 ENCOUNTER — Encounter: Payer: Self-pay | Admitting: Family Medicine

## 2023-05-09 VITALS — BP 164/82 | HR 93 | Ht 67.0 in | Wt 188.8 lb

## 2023-05-09 DIAGNOSIS — I10 Essential (primary) hypertension: Secondary | ICD-10-CM | POA: Diagnosis not present

## 2023-05-09 DIAGNOSIS — F3341 Major depressive disorder, recurrent, in partial remission: Secondary | ICD-10-CM

## 2023-05-09 DIAGNOSIS — R635 Abnormal weight gain: Secondary | ICD-10-CM

## 2023-05-09 DIAGNOSIS — E7800 Pure hypercholesterolemia, unspecified: Secondary | ICD-10-CM

## 2023-05-09 DIAGNOSIS — E559 Vitamin D deficiency, unspecified: Secondary | ICD-10-CM

## 2023-05-09 DIAGNOSIS — R7303 Prediabetes: Secondary | ICD-10-CM

## 2023-05-09 DIAGNOSIS — G43009 Migraine without aura, not intractable, without status migrainosus: Secondary | ICD-10-CM

## 2023-05-09 MED ORDER — SUMATRIPTAN SUCCINATE 100 MG PO TABS: 100.0000 mg | ORAL_TABLET | ORAL | 2 refills | Status: AC | PRN

## 2023-05-09 MED ORDER — VENLAFAXINE HCL ER 150 MG PO CP24: ORAL_CAPSULE | ORAL | 3 refills | Status: AC

## 2023-05-09 NOTE — Progress Notes (Signed)
Established patient visit   Patient: Kristi Dominguez   DOB: March 30, 1966   57 y.o. Female  MRN: [id number] Visit Date: 05/09/2023  Today's healthcare provider: Jacky Kindle, FNP  Introduced to nurse practitioner role and practice setting.  All questions answered.  Discussed provider/patient relationship and expectations.  Subjective    HPI HPI     Medical Management of Chronic Issues    Additional comments: Would like a blood panel on vitamin D        Comments   Venlafaxine and sumatriptan       Last edited by Rolly Salter, CMA on 05/09/2023  2:49 PM.      The patient, with a history of hypertension and anxiety, presents for medication refills. She reports a recent weight gain and expresses concern about the potential for prediabetes. She also mentions a significant amount of stress related to family business issues and a busy personal schedule, including a trip to [place] and various appointments. The patient denies any new symptoms but notes that her blood pressure was higher than usual at this visit, which she attributes to stress. She also mentions a desire to check her vitamin D levels.  Medications: Outpatient Medications Prior to Visit  Medication Sig   clobetasol cream (TEMOVATE) 0.05 % Apply to itchy area on R arm QD PRN. Avoid applying to face, groin, and axilla. Use as directed. Long-term use can cause thinning of the skin.   ketoconazole (NIZORAL) 2 % cream Apply to aa's QD PRN.   Ruxolitinib Phosphate (OPZELURA) 1.5 % CREA Apply to itchy area on R arm QD PRN.   SUMAtriptan (IMITREX) 100 MG tablet TAKE 1 TABLET BY MOUTH EVERY 2 HOURS AS NEEDED FOR MIGRAINE. DO NOT TAKE MORE THAN 200 MG IN 24 HOURS.   Vitamin D, Ergocalciferol, (DRISDOL) 1.25 MG (50000 UNIT) CAPS capsule TAKE 1 CAPSULE EVERY 7 DAYS   [DISCONTINUED] venlafaxine XR (EFFEXOR-XR) 150 MG 24 hr capsule TAKE 1 CAPSULE BY MOUTH EVERY DAY WITH BREAKFAST   No facility-administered medications prior  to visit.     Objective    BP (!) 164/83 (BP Location: Left Arm, Patient Position: Sitting, Cuff Size: Large)   Pulse 93   Ht 5\' 7"  (1.702 m)   Wt 188 lb 12.8 oz (85.6 kg)   SpO2 96%   BMI 29.57 kg/m   Physical Exam Vitals and nursing note reviewed.  Constitutional:      General: She is not in acute distress.    Appearance: Normal appearance. She is overweight. She is not ill-appearing, toxic-appearing or diaphoretic.  HENT:     Head: Normocephalic and atraumatic.  Cardiovascular:     Rate and Rhythm: Normal rate and regular rhythm.     Pulses: Normal pulses.     Heart sounds: Normal heart sounds. No murmur heard.    No friction rub. No gallop.  Pulmonary:     Effort: Pulmonary effort is normal. No respiratory distress.     Breath sounds: Normal breath sounds. No stridor. No wheezing, rhonchi or rales.  Chest:     Chest wall: No tenderness.  Musculoskeletal:        General: No swelling, tenderness, deformity or signs of injury. Normal range of motion.     Right lower leg: No edema.     Left lower leg: No edema.  Skin:    General: Skin is warm and dry.     Capillary Refill: Capillary refill takes less than 2 seconds.  Coloration: Skin is not jaundiced or pale.     Findings: No bruising, erythema, lesion or rash.  Neurological:     General: No focal deficit present.     Mental Status: She is alert and oriented to person, place, and time. Mental status is at baseline.     Cranial Nerves: No cranial nerve deficit.     Sensory: No sensory deficit.     Motor: No weakness.     Coordination: Coordination normal.  Psychiatric:        Mood and Affect: Mood normal.        Behavior: Behavior normal.        Thought Content: Thought content normal.        Judgment: Judgment normal.     No results found for any visits on 05/09/23.  Assessment & Plan     Problem List Items Addressed This Visit       Other   Avitaminosis D - Primary   Relevant Orders   Vitamin D (25  hydroxy)   Elevated LDL cholesterol level   Relevant Orders   Lipid panel   Recurrent major depressive disorder, in partial remission (HCC)   Relevant Medications   venlafaxine XR (EFFEXOR-XR) 150 MG 24 hr capsule   Other Relevant Orders   B12 and Folate Panel   Weight gain   Relevant Orders   CBC with Differential/Platelet   Comprehensive Metabolic Panel (CMET)   TSH   Other Visit Diagnoses     Borderline diabetes       Relevant Orders   Hemoglobin A1c      Elevated Blood Pressure Noted elevated blood pressure during visit, possibly due to stress. No symptoms of hypertension reported. -Monitor blood pressure at home or at local pharmacy. -If readings consistently above 140/90, consider initiation of antihypertensive medication.  Weight Gain Patient reported weight gain and concern for prediabetes. Last A1C was 5.6, just below prediabetes threshold. -Order labs including A1C, lipid panel, and thyroid function tests. -Encourage healthy diet and regular exercise.  Vitamin D Deficiency Patient requested Vitamin D check. -Order Vitamin D level.  Anxiety Patient reported significant stress and anxiety related to family and work situations. Currently on medication. -Refill current anxiety medication. -Encourage patient to consider counseling or therapy apps for additional support.  General Health Maintenance -Declined flu vaccine due to upcoming travel. -Check complete blood count and blood chemistry as part of routine physical. -Follow-up appointment as CPE in 3 months if A1C in prediabetic range.  Leilani Merl, FNP, have reviewed all documentation for this visit. The documentation on 05/09/23 for the exam, diagnosis, procedures, and orders are all accurate and complete.  Jacky Kindle, FNP  [facility] [phone] (phone) [phone] (fax)  [facility]

## 2023-05-10 LAB — CBC WITH DIFFERENTIAL/PLATELET
Basophils Absolute: 0 10*3/uL (ref 0.0–0.2)
Basos: 0 %
EOS (ABSOLUTE): 0.2 10*3/uL (ref 0.0–0.4)
Eos: 3 %
Hematocrit: 42.5 % (ref 34.0–46.6)
Hemoglobin: 14.1 g/dL (ref 11.1–15.9)
Immature Grans (Abs): 0 10*3/uL (ref 0.0–0.1)
Immature Granulocytes: 0 %
Lymphocytes Absolute: 1.8 10*3/uL (ref 0.7–3.1)
Lymphs: 25 %
MCH: 31.2 pg (ref 26.6–33.0)
MCHC: 33.2 g/dL (ref 31.5–35.7)
MCV: 94 fL (ref 79–97)
Monocytes Absolute: 0.5 10*3/uL (ref 0.1–0.9)
Monocytes: 7 %
Neutrophils Absolute: 4.6 10*3/uL (ref 1.4–7.0)
Neutrophils: 65 %
Platelets: 241 10*3/uL (ref 150–450)
RBC: 4.52 x10E6/uL (ref 3.77–5.28)
RDW: 12.7 % (ref 11.7–15.4)
WBC: 7.1 10*3/uL (ref 3.4–10.8)

## 2023-05-10 LAB — LIPID PANEL
Chol/HDL Ratio: 3.6 {ratio} (ref 0.0–4.4)
Cholesterol, Total: 287 mg/dL — ABNORMAL HIGH (ref 100–199)
HDL: 80 mg/dL (ref >39)
LDL Chol Calc (NIH): 182 mg/dL — ABNORMAL HIGH (ref 0–99)
Triglycerides: 140 mg/dL (ref 0–149)
VLDL Cholesterol Cal: 25 mg/dL (ref 5–40)

## 2023-05-10 LAB — COMPREHENSIVE METABOLIC PANEL
ALT: 17 [IU]/L (ref 0–32)
AST: 20 [IU]/L (ref 0–40)
Albumin: 4.5 g/dL (ref 3.8–4.9)
Alkaline Phosphatase: 70 [IU]/L (ref 44–121)
BUN/Creatinine Ratio: 28 — ABNORMAL HIGH (ref 9–23)
BUN: 21 mg/dL (ref 6–24)
Bilirubin Total: <0.2 mg/dL (ref 0.0–1.2)
CO2: 23 mmol/L (ref 20–29)
Calcium: 9.6 mg/dL (ref 8.7–10.2)
Chloride: 101 mmol/L (ref 96–106)
Creatinine, Ser: 0.74 mg/dL (ref 0.57–1.00)
Globulin, Total: 2.3 g/dL (ref 1.5–4.5)
Glucose: 89 mg/dL (ref 70–99)
Potassium: 4.4 mmol/L (ref 3.5–5.2)
Sodium: 137 mmol/L (ref 134–144)
Total Protein: 6.8 g/dL (ref 6.0–8.5)
eGFR: 95 mL/min/{1.73_m2} (ref >59)

## 2023-05-10 LAB — HEMOGLOBIN A1C
Est. average glucose Bld gHb Est-mCnc: 114 mg/dL
Hgb A1c MFr Bld: 5.6 % (ref 4.8–5.6)

## 2023-05-10 LAB — VITAMIN D 25 HYDROXY (VIT D DEFICIENCY, FRACTURES): Vit D, 25-Hydroxy: 28 ng/mL — ABNORMAL LOW (ref 30.0–100.0)

## 2023-05-10 LAB — TSH: TSH: 2.63 u[IU]/mL (ref 0.450–4.500)

## 2023-05-10 LAB — B12 AND FOLATE PANEL
Folate: 8.9 ng/mL (ref >3.0)
Vitamin B-12: 529 pg/mL (ref 232–1245)

## 2023-05-10 NOTE — Progress Notes (Signed)
Recommend 5000 IU Vit D3 daily with meal. Cholesterol has increased; The 10-year ASCVD risk score (Arnett DK, et al., 2019) is: 3.3% I continue to recommend diet low in saturated fat and regular exercise - 30 min at least 5 times per week Consider addition of Crestor 10 mg daily to reduce LDL and total cholesterol. A1c remains at 5.6% Continue to recommend balanced, lower carb meals. Smaller meal size, adding snacks. Choosing water as drink of choice and increasing purposeful exercise.

## 2023-05-13 ENCOUNTER — Other Ambulatory Visit: Payer: Self-pay | Admitting: Family Medicine

## 2023-05-13 DIAGNOSIS — E559 Vitamin D deficiency, unspecified: Secondary | ICD-10-CM

## 2023-05-13 MED ORDER — VITAMIN D (ERGOCALCIFEROL) 1.25 MG (50000 UNIT) PO CAPS: 50000.0000 [IU] | ORAL_CAPSULE | ORAL | 3 refills | Status: AC

## 2023-06-10 ENCOUNTER — Ambulatory Visit: Payer: 59 | Admitting: Dermatology

## 2023-06-10 DIAGNOSIS — L570 Actinic keratosis: Secondary | ICD-10-CM | POA: Diagnosis not present

## 2023-06-10 DIAGNOSIS — D225 Melanocytic nevi of trunk: Secondary | ICD-10-CM

## 2023-06-10 DIAGNOSIS — W908XXA Exposure to other nonionizing radiation, initial encounter: Secondary | ICD-10-CM | POA: Diagnosis not present

## 2023-06-10 DIAGNOSIS — L814 Other melanin hyperpigmentation: Secondary | ICD-10-CM

## 2023-06-10 DIAGNOSIS — D2262 Melanocytic nevi of left upper limb, including shoulder: Secondary | ICD-10-CM

## 2023-06-10 DIAGNOSIS — Z86018 Personal history of other benign neoplasm: Secondary | ICD-10-CM

## 2023-06-10 DIAGNOSIS — L304 Erythema intertrigo: Secondary | ICD-10-CM

## 2023-06-10 DIAGNOSIS — Z1283 Encounter for screening for malignant neoplasm of skin: Secondary | ICD-10-CM | POA: Diagnosis not present

## 2023-06-10 DIAGNOSIS — D1723 Benign lipomatous neoplasm of skin and subcutaneous tissue of right leg: Secondary | ICD-10-CM

## 2023-06-10 DIAGNOSIS — L700 Acne vulgaris: Secondary | ICD-10-CM

## 2023-06-10 DIAGNOSIS — R238 Other skin changes: Secondary | ICD-10-CM

## 2023-06-10 DIAGNOSIS — L988 Other specified disorders of the skin and subcutaneous tissue: Secondary | ICD-10-CM

## 2023-06-10 DIAGNOSIS — L578 Other skin changes due to chronic exposure to nonionizing radiation: Secondary | ICD-10-CM

## 2023-06-10 DIAGNOSIS — D1801 Hemangioma of skin and subcutaneous tissue: Secondary | ICD-10-CM

## 2023-06-10 DIAGNOSIS — L821 Other seborrheic keratosis: Secondary | ICD-10-CM

## 2023-06-10 DIAGNOSIS — D229 Melanocytic nevi, unspecified: Secondary | ICD-10-CM

## 2023-06-10 MED ORDER — TRETINOIN 0.05 % EX CREA: TOPICAL_CREAM | CUTANEOUS | 6 refills | Status: DC

## 2023-06-10 MED ORDER — KETOCONAZOLE 2 % EX CREA: TOPICAL_CREAM | CUTANEOUS | 5 refills | Status: DC

## 2023-06-10 NOTE — Progress Notes (Signed)
Follow-Up Visit   Subjective  Kristi Dominguez is a 57 y.o. female who presents for the following: Skin Cancer Screening and Full Body Skin Exam Noticed a spot on abdomen feels may have grown   The patient presents for Total-Body Skin Exam (TBSE) for skin cancer screening and mole check. The patient has spots, moles and lesions to be evaluated, some may be new or changing and the patient may have concern these could be cancer.    The following portions of the chart were reviewed this encounter and updated as appropriate: medications, allergies, medical history  Review of Systems:  No other skin or systemic complaints except as noted in HPI or Assessment and Plan.  Objective  Well appearing patient in no apparent distress; mood and affect are within normal limits.  A full examination was performed including scalp, head, eyes, ears, nose, lips, neck, chest, axillae, abdomen, back, buttocks, bilateral upper extremities, bilateral lower extremities, hands, feet, fingers, toes, fingernails, and toenails. All findings within normal limits unless otherwise noted below.   Relevant physical exam findings are noted in the Assessment and Plan.  frontal hairline x 1 Erythematous thin papules/macules with gritty scale.     Assessment & Plan   SKIN CANCER SCREENING PERFORMED TODAY.  ACTINIC DAMAGE - Chronic condition, secondary to cumulative UV/sun exposure - diffuse scaly erythematous macules with underlying dyspigmentation - Recommend daily broad spectrum sunscreen SPF 30+ to sun-exposed areas, reapply every 2 hours as needed.  - Staying in the shade or wearing long sleeves, sun glasses (UVA+UVB protection) and wide brim hats (4-inch brim around the entire circumference of the hat) are also recommended for sun protection.  - Call for new or changing lesions.  LENTIGINES, SEBORRHEIC KERATOSES, HEMANGIOMAS - Benign normal skin lesions - Sk at right abdomen  - Benign-appearing - Call  for any changes   MELANOCYTIC NEVI - Tan-brown and/or pink-flesh-colored symmetric macules and papules - Benign appearing on exam today - Observation - Call clinic for new or changing moles - Recommend daily use of broad spectrum spf 30+ sunscreen to sun-exposed areas.   Nevus (3)   Reviewed photos from 2021 (consider taking new photos next visit) Left spinal lower back 4 x 3 mm regular medium dark brown macule.  left lower back at waistline  4 x 2.5 mm regular medium dark brown macule    L upper arm 0.4 cm medium brown macule.    L abdomen 0.4 cm medium dark brown macule   Right lower back 7 x 4 mm medium dark brown macule lighter rim    Benign-appearing. Stable compared to previous visit. Observation.  Call clinic for new or changing moles.  Recommend daily use of broad spectrum spf 30+ sunscreen to sun-exposed areas.      ACNE VULGARIS Exam: closed comedones on face, some inflamed   Chronic and persistent condition with duration or expected duration over one year. Condition is bothersome/symptomatic for patient. Currently flared.   Treatment Plan: Discussed retinoid tx Start Tretinoin 0.05 % cream use pea sized amount face nightly   Topical retinoid medications like tretinoin/Retin-A, adapalene/Differin, tazarotene/Fabior, and Epiduo/Epiduo Forte can cause dryness and irritation when first started. Only apply a pea-sized amount to the entire affected area. Avoid applying it around the eyes, edges of mouth and creases at the nose. If you experience irritation, use a good moisturizer first and/or apply the medicine less often. If you are doing well with the medicine, you can increase how often you use it  until you are applying every night. Be careful with sun protection while using this medication as it can make you sensitive to the sun. This medicine should not be used by pregnant women.    FACIAL ELASTOSIS Exam: Rhytides and volume loss.  Treatment Plan: Discussed  SkinTyte to help areas at neck and jaw area and BBL to face for discoloration  3 sessions $350 per treatment for BBL, plus $250 add-on for skin tyte  Counseling for BBL / IPL / Laser and Coordination of Care Discussed the treatment option of Broad Band Light (BBL) /Intense Pulsed Light (IPL)/ Laser for skin discoloration, including brown spots and redness.  Typically we recommend at least 1-3 treatment sessions about 5-8 weeks apart for best results.  Cannot have tanned skin when BBL performed, and regular use of sunscreen/photoprotection is advised after the procedure to help maintain results. The patient's condition may also require "maintenance treatments" in the future.  The fee for BBL / laser treatments is $350 per treatment session for the whole face.  A fee can be quoted for other parts of the body.  Insurance typically does not pay for BBL/laser treatments and therefore the fee is an out-of-pocket cost. Recommend prophylactic valtrex treatment. Once scheduled for procedure, will send Rx in prior to patient's appointment.      Recommend daily broad spectrum sunscreen SPF 30+ to sun-exposed areas, reapply every 2 hours as needed. Call for new or changing lesions.  Staying in the shade or wearing long sleeves, sun glasses (UVA+UVB protection) and wide brim hats (4-inch brim around the entire circumference of the hat) are also recommended for sun protection.    Lipoma  Exam: 1 cm Subcutaneous rubbery nodule Location: right anterior thigh   Benign-appearing. Exam most consistent with a lipoma. Discussed that a lipoma is a benign fatty growth that can grow over time and sometimes get irritated. Recommend observation if it is not bothersome or changing. Discussed option of ILK injections or surgical excision to remove it if it is growing, symptomatic, or other changes noted. Please call for new or changing lesions so they can be evaluated.    HISTORY OF DYSPLASTIC NEVUS Multiple areas see  history  No evidence of recurrence today Recommend regular full body skin exams Recommend daily broad spectrum sunscreen SPF 30+ to sun-exposed areas, reapply every 2 hours as needed.  Call if any new or changing lesions are noted between office visits   Inflammatory Papule on Left anterior thigh Exam: pink follicular papule   Treatment Plan: Benign. Observe. RTC if doesn't clear    Erythema intertrigo BL Inframammary Fold  Exam :  scattered small pink papules   Chronic and persistent condition with duration or expected duration over one year. Condition is symptomatic/ bothersome to patient. Not currently at goal.    Intertrigo is a chronic recurrent rash that occurs in skin fold areas that may be associated with friction; heat; moisture; yeast; fungus; and bacteria.  It is exacerbated by increased movement / activity; sweating; and higher atmospheric temperature.   Treatment Plan:  restart Ketoconazole 2% cream to aa's Qhs and OTC Zeasorb AF powder daily.    Could try antiperspirant for under breast qhs  Acne vulgaris  Related Medications tretinoin (RETIN-A) 0.05 % cream Apply pea sized amount topically to face nightly as tolerated.  Erythema intertrigo  Related Medications ketoconazole (NIZORAL) 2 % cream Apply to aa's QD PRN for rash  Actinic keratosis frontal hairline x 1  Vs ISK  Actinic keratoses  are precancerous spots that appear secondary to cumulative UV radiation exposure/sun exposure over time. They are chronic with expected duration over 1 year. A portion of actinic keratoses will progress to squamous cell carcinoma of the skin. It is not possible to reliably predict which spots will progress to skin cancer and so treatment is recommended to prevent development of skin cancer.  Recommend daily broad spectrum sunscreen SPF 30+ to sun-exposed areas, reapply every 2 hours as needed.  Recommend staying in the shade or wearing long sleeves, sun glasses (UVA+UVB  protection) and wide brim hats (4-inch brim around the entire circumference of the hat). Call for new or changing lesions.  Destruction of lesion - frontal hairline x 1  Destruction method: cryotherapy   Informed consent: discussed and consent obtained   Lesion destroyed using liquid nitrogen: Yes   Region frozen until ice ball extended beyond lesion: Yes   Outcome: patient tolerated procedure well with no complications   Post-procedure details: wound care instructions given   Additional details:  Prior to procedure, discussed risks of blister formation, small wound, skin dyspigmentation, or rare scar following cryotherapy. Recommend Vaseline ointment to treated areas while healing.    Return in about 1 year (around 06/09/2024) for TBSE.  I, Asher Muir, CMA, am acting as scribe for Willeen Niece, MD.   Documentation: I have reviewed the above documentation for accuracy and completeness, and I agree with the above.  Willeen Niece, MD

## 2023-06-10 NOTE — Patient Instructions (Addendum)
Skin tyte for neck - can be done year round 3 treatments for each    Best done in fall/ winter months  3 treatments for each  Counseling for BBL / IPL / Laser and Coordination of Care Discussed the treatment option of Broad Band Light (BBL) /Intense Pulsed Light (IPL)/ Laser for skin discoloration, including brown spots and redness.  Typically we recommend at least 1-3 treatment sessions about 5-8 weeks apart for best results.  Cannot have tanned skin when BBL performed, and regular use of sunscreen/photoprotection is advised after the procedure to help maintain results. The patient's condition may also require "maintenance treatments" in the future.  The fee for BBL / laser treatments is $350 per treatment session for the whole face.  A fee can be quoted for other parts of the body.  Insurance typically does not pay for BBL/laser treatments and therefore the fee is an out-of-pocket cost. Recommend prophylactic valtrex treatment. Once scheduled for procedure, will send Rx in prior to patient's appointment.     Actinic keratoses are precancerous spots that appear secondary to cumulative UV radiation exposure/sun exposure over time. They are chronic with expected duration over 1 year. A portion of actinic keratoses will progress to squamous cell carcinoma of the skin. It is not possible to reliably predict which spots will progress to skin cancer and so treatment is recommended to prevent development of skin cancer.  Recommend daily broad spectrum sunscreen SPF 30+ to sun-exposed areas, reapply every 2 hours as needed.  Recommend staying in the shade or wearing long sleeves, sun glasses (UVA+UVB protection) and wide brim hats (4-inch brim around the entire circumference of the hat). Call for new or changing lesions.   Cryotherapy Aftercare  Wash gently with soap and water everyday.   Apply Vaseline and Band-Aid daily until healed.   For acne   Start tretinoin 0.05 % cream - use pea sized amount  to face nightly as tolerated.   Topical retinoid medications like tretinoin/Retin-A, adapalene/Differin, tazarotene/Fabior, and Epiduo/Epiduo Forte can cause dryness and irritation when first started. Only apply a pea-sized amount to the entire affected area. Avoid applying it around the eyes, edges of mouth and creases at the nose. If you experience irritation, use a good moisturizer first and/or apply the medicine less often. If you are doing well with the medicine, you can increase how often you use it until you are applying every night. Be careful with sun protection while using this medication as it can make you sensitive to the sun. This medicine should not be used by pregnant women.      For sweating under breast Could try antiperspirant for under breast   Can continue Ketoconazole 2 % cream to affected areas nightly for rash . Dont apply powder over cream.  Recommend OTC Zeasorb AF powder to body folds daily after shower.  It is often found in the athlete's foot section in the pharmacy.  Avoid using powders that contain cornstarch.  Melanoma ABCDEs  Melanoma is the most dangerous type of skin cancer, and is the leading cause of death from skin disease.  You are more likely to develop melanoma if you: Have light-colored skin, light-colored eyes, or red or blond hair Spend a lot of time in the sun Tan regularly, either outdoors or in a tanning bed Have had blistering sunburns, especially during childhood Have a close family member who has had a melanoma Have atypical moles or large birthmarks  Early detection of melanoma is key since  treatment is typically straightforward and cure rates are extremely high if we catch it early.   The first sign of melanoma is often a change in a mole or a new dark spot.  The ABCDE system is a way of remembering the signs of melanoma.  A for asymmetry:  The two halves do not match. B for border:  The edges of the growth are irregular. C for color:  A  mixture of colors are present instead of an even brown color. D for diameter:  Melanomas are usually (but not always) greater than 6mm - the size of a pencil eraser. E for evolution:  The spot keeps changing in size, shape, and color.  Please check your skin once per month between visits. You can use a small mirror in front and a large mirror behind you to keep an eye on the back side or your body.   If you see any new or changing lesions before your next follow-up, please call to schedule a visit.  Please continue daily skin protection including broad spectrum sunscreen SPF 30+ to sun-exposed areas, reapplying every 2 hours as needed when you're outdoors.   Staying in the shade or wearing long sleeves, sun glasses (UVA+UVB protection) and wide brim hats (4-inch brim around the entire circumference of the hat) are also recommended for sun protection.    Due to recent changes in healthcare laws, you may see results of your pathology and/or laboratory studies on MyChart before the doctors have had a chance to review them. We understand that in some cases there may be results that are confusing or concerning to you. Please understand that not all results are received at the same time and often the doctors may need to interpret multiple results in order to provide you with the best plan of care or course of treatment. Therefore, we ask that you please give [place] 2 business days to thoroughly review all your results before contacting the office for clarification. Should we see a critical lab result, you will be contacted sooner.   If You Need Anything After Your Visit  If you have any questions or concerns for your doctor, please call our main line at [phone] and press option 4 to reach your doctor's medical assistant. If no one answers, please leave a voicemail as directed and we will return your call as soon as possible. Messages left after 4 pm will be answered the following business day.   You  may also send [place] a message via MyChart. We typically respond to MyChart messages within 1-2 business days.  For prescription refills, please ask your pharmacy to contact our office. Our fax number is [phone].  If you have an urgent issue when the clinic is closed that cannot wait until the next business day, you can page your doctor at the number below.    Please note that while we do our best to be available for urgent issues outside of office hours, we are not available 24/7.   If you have an urgent issue and are unable to reach [place], you may choose to seek medical care at your doctor's office, retail clinic, urgent care center, or emergency room.  If you have a medical emergency, please immediately call 911 or go to the emergency department.  Pager Numbers  - Dr. Gwen Pounds: [phone]  - Dr. Roseanne Reno: [phone]  - Dr. Katrinka Blazing: [phone]   In the event of inclement weather, please call our main line at [phone] for an  update on the status of any delays or closures.  Dermatology Medication Tips: Please keep the boxes that topical medications come in in order to help keep track of the instructions about where and how to use these. Pharmacies typically print the medication instructions only on the boxes and not directly on the medication tubes.   If your medication is too expensive, please contact our office at [phone] option 4 or send [place] a message through MyChart.   We are unable to tell what your co-pay for medications will be in advance as this is different depending on your insurance coverage. However, we may be able to find a substitute medication at lower cost or fill out paperwork to get insurance to cover a needed medication.   If a prior authorization is required to get your medication covered by your insurance company, please allow [place] 1-2 business days to complete this process.  Drug prices often vary depending on where the prescription is filled and some  pharmacies may offer cheaper prices.  The website www.goodrx.com contains coupons for medications through different pharmacies. The prices here do not account for what the cost may be with help from insurance (it may be cheaper with your insurance), but the website can give you the price if you did not use any insurance.  - You can print the associated coupon and take it with your prescription to the pharmacy.  - You may also stop by our office during regular business hours and pick up a GoodRx coupon card.  - If you need your prescription sent electronically to a different pharmacy, notify our office through [facility] or by phone at [phone] option 4.     Si Usted Necesita Algo Despus de Su Visita  Tambin puede enviarnos un mensaje a travs de Clinical cytogeneticist. Por lo general respondemos a los mensajes de MyChart en el transcurso de 1 a 2 das hbiles.  Para renovar recetas, por favor pida a su farmacia que se ponga en contacto con nuestra oficina. Annie Sable de fax es [place] [phone].  Si tiene un asunto urgente cuando la clnica est cerrada y que no puede esperar hasta el siguiente da hbil, puede llamar/localizar a su doctor(a) al nmero que aparece a continuacin.   Por favor, tenga en cuenta que aunque hacemos todo lo posible para estar disponibles para asuntos urgentes fuera del horario de [place], no estamos disponibles las 24 horas del da, los 7 [address]  [address] de la [place].   Si tiene un problema urgente y no puede comunicarse con nosotros, puede optar por buscar atencin mdica  en el consultorio de su doctor(a), en una clnica privada, en un centro de atencin urgente o en una sala de emergencias.  Si tiene Engineer, drilling, por favor llame inmediatamente al 911 o vaya a la sala de emergencias.  Nmeros de bper  - Dr. Gwen Pounds: [phone]  - Dra. Roseanne Reno: [phone]  - Dr. Katrinka Blazing: [phone]   En caso de inclemencias del tiempo, por favor llame a Lacy Duverney principal al [phone] para una actualizacin sobre el [place] de cualquier retraso o cierre.  Consejos para la medicacin en dermatologa: Por favor, guarde las cajas en las que vienen los medicamentos de uso tpico para ayudarle a seguir las instrucciones sobre dnde y cmo usarlos. Las farmacias generalmente imprimen las instrucciones del medicamento slo en las cajas y no directamente en los tubos del [place].   Si su medicamento es [employer], por favor, pngase en contacto con Ferne Coe oficina  llamando al [phone] y presione la opcin 4 o envenos un mensaje a travs de Clinical cytogeneticist.   No podemos decirle cul ser su copago por los medicamentos por adelantado ya que esto es diferente dependiendo de la cobertura de su seguro. Sin embargo, es posible que podamos encontrar un medicamento sustituto a Audiological scientist un formulario para que el seguro cubra el medicamento que se considera necesario.   Si se requiere una autorizacin previa para que su compaa de seguros [place] su medicamento, por favor permtanos de 1 a 2 das hbiles para completar [address]  Los precios de los medicamentos varan con frecuencia dependiendo del Environmental consultant de dnde se surte la receta y alguna farmacias pueden ofrecer precios ms baratos.  El sitio web www.goodrx.com tiene cupones para medicamentos de Health and safety inspector. Los precios aqu no tienen en cuenta lo que podra costar con la ayuda del seguro (puede ser ms barato con su seguro), pero el sitio web puede darle el precio si no utiliz Tourist information centre manager.  - Puede imprimir el cupn correspondiente y llevarlo con su receta a la farmacia.  - Tambin puede pasar por nuestra oficina durante el horario de atencin regular y Education officer, museum una tarjeta de cupones de GoodRx.  - Si necesita que su receta se enve electrnicamente a una farmacia diferente, informe a nuestra oficina a travs de MyChart de [place] o por telfono llamando al [phone] y presione la  opcin 4.

## 2023-07-03 ENCOUNTER — Telehealth: Payer: Self-pay

## 2023-07-03 NOTE — Telephone Encounter (Signed)
Left message for patient to call [id number] valtrex treatment for BBL/Skintyte appt Wed, 07/10/23.

## 2023-07-04 MED ORDER — VALACYCLOVIR HCL 500 MG PO TABS: 500.0000 mg | ORAL_TABLET | Freq: Two times a day (BID) | ORAL | 0 refills | Status: AC

## 2023-07-04 NOTE — Addendum Note (Signed)
Addended by: Dorathy Daft R on: 07/04/2023 10:39 AM   Modules accepted: Orders

## 2023-07-04 NOTE — Telephone Encounter (Signed)
Patient advised and Valtrex sent in. aw

## 2023-07-08 ENCOUNTER — Other Ambulatory Visit: Payer: Self-pay | Admitting: Internal Medicine

## 2023-07-08 DIAGNOSIS — Z1231 Encounter for screening mammogram for malignant neoplasm of breast: Secondary | ICD-10-CM

## 2023-07-10 ENCOUNTER — Other Ambulatory Visit: Payer: Self-pay | Admitting: Internal Medicine

## 2023-07-10 ENCOUNTER — Ambulatory Visit (INDEPENDENT_AMBULATORY_CARE_PROVIDER_SITE_OTHER): Payer: Self-pay | Admitting: Dermatology

## 2023-07-10 DIAGNOSIS — Z0000 Encounter for general adult medical examination without abnormal findings: Secondary | ICD-10-CM

## 2023-07-10 DIAGNOSIS — L719 Rosacea, unspecified: Secondary | ICD-10-CM

## 2023-07-10 DIAGNOSIS — L814 Other melanin hyperpigmentation: Secondary | ICD-10-CM

## 2023-07-10 DIAGNOSIS — L988 Other specified disorders of the skin and subcutaneous tissue: Secondary | ICD-10-CM

## 2023-07-10 DIAGNOSIS — I10 Essential (primary) hypertension: Secondary | ICD-10-CM

## 2023-07-10 DIAGNOSIS — E7800 Pure hypercholesterolemia, unspecified: Secondary | ICD-10-CM

## 2023-07-10 DIAGNOSIS — Z8249 Family history of ischemic heart disease and other diseases of the circulatory system: Secondary | ICD-10-CM

## 2023-07-10 NOTE — Progress Notes (Signed)
Follow-Up Visit   Subjective  Kristi Dominguez is a 57 y.o. female who presents for the following: Lentigines and telangiectasias of the face, BBL first treatment today. Skin tyte today to the neck, no charge.    The following portions of the chart were reviewed this encounter and updated as appropriate: medications, allergies, medical history  Review of Systems:  No other skin or systemic complaints except as noted in HPI or Assessment and Plan.  Objective  Well appearing patient in no apparent distress; mood and affect are within normal limits.  A focused examination was performed of the following areas: Face, neck  Relevant physical exam findings are noted in the Assessment and Plan.  face Scattered tan macules.  face Mid face erythema with telangiectasias. neck Diffuse scaly erythematous macules with underlying dyspigmentation.             1st pass  2nd pass  3rd pass   Skintyte Photorejuvenation Anterior neck   Left neck   Right neck    Assessment & Plan   LENTIGINES face BBL today, 1st treatment. Photorejuvenation - face Prior to the procedure, the patient's past medical history, medications, allergies, and the rare but potential risks and complications were reviewed with the patient and a signed consent was obtained.  Pre and post treatment care was discussed and instructions provided.   [place] BBL - 07/10/23 1400      Patient Details   Skin Type: II    Anesthestic Cream Applied: No    Photo Takes: Yes    Consent Signed: Yes      Treatment Details   Date: 07/10/23    Treatment #: 1    Area: face  Filter: 1st Pass;2nd Pass;3rd Pass      1st Pass   Location: F  cheeks, jaw  Device: 515 Filter    BBL j/cm2: 10    PW Msec Sec: 10    Cooling Temp: 15    Pulses: 89    15x45: This crystal used.      2nd Pass   Location: F  forehead, nose, malar cheeks, chin  Device: 515 Filter    BBL j/cm2: 12    PW Msec Sec: 10    Cooling  Temp: 15    Pulses: 127    15x15: This crystal used.      Patient tolerated the procedure well.   Wynelle Link avoidance was stressed. The patient will call with any problems, questions or concerns prior to their next appointment.  Laser safety: Patient was advised in laser safety.  Patient was fitted with laser safety goggles and advised to keep eyes closed during procedure with goggles on. Staff and provider ensured that patient and their own safety goggles were also on and eyes protected during procedure. Laser room door was secured and locked from the inside. Laser room door has laser safety sign affixed to the outside of the door.   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 It is treatable but not curable. It sometimes affects the eyes (ocular rosacea) as well. It may respond to topical and/or systemic medication and can flare with stress, sun exposure, alcohol, exercise, topical steroids (including hydrocortisone/cortisone 10) and some foods.  Daily application of broad spectrum spf 30+ sunscreen to face is recommended to reduce flares. Photorejuvenation - face Prior to the procedure, the patient's past medical history, medications, allergies,  and the rare but potential risks and complications were reviewed with the patient and a signed consent was obtained.  Pre and post treatment care was discussed and instructions provided.   [place] BBL - 07/10/23 1400      Patient Details   Skin Type: II    Anesthestic Cream Applied: No    Photo Takes: Yes    Consent Signed: Yes      Treatment Details   Date: 07/10/23    Treatment #: 1    Area: face    Filter: 1st Pass;2nd Pass;3rd Pass       3rd Pass   Location: F  mid face, nose  Device: 560 Filter    BBL j/cm2: 15    PW Msec Sec: 15    Cooling Temp: 15    Pulses: 60    15x15: This crystal used.          Patient  tolerated the procedure well.   Wynelle Link avoidance was stressed. The patient will call with any problems, questions or concerns prior to their next appointment.  Laser safety: Patient was advised in laser safety.  Patient was fitted with laser safety goggles and advised to keep eyes closed during procedure with goggles on. Staff and provider ensured that patient and their own safety goggles were also on and eyes protected during procedure. Laser room door was secured and locked from the inside. Laser room door has laser safety sign affixed to the outside of the door.   ELASTOSIS OF SKIN neck No charge for Skin Tyte today, due to session being a practice for the provider.  Next treatment will be charged Photorejuvenation - neck    Skintyte Motion - 07/10/23 1400      Treatment Details   Date: 07/10/23    Photos (Yes/No): Yes    ST Filter: 590ST    Body Location: neck    Full Crystal or Square Adapter: Full    Intensity W/cm2: 12    Time in Seconds: 12 seconds    Time on Tissue at Desired Temp: --   Per protocol (4 minutes for each  area of neck)  Cooling: 30    External Temp A: --   Target temperature 40-41 degrees reached   Total Number of Pulses: 296    Total Fluence: --   Ant neck 25,452; L neck 63,419; R neck 100,490   Patient tolerated the procedure well.    Wynelle Link avoidance was stressed. The patient will call with any problems, questions or concerns prior to their next appointment.            Laser kit and Gel ice mask given to patient today.  Return in about 4 weeks (around 08/07/2023) for BBL, SkinTyte f/u.  ICherlyn Labella, CMA, am acting as scribe for Willeen Niece, MD .   Documentation: I have reviewed the above documentation for accuracy and completeness, and I agree with the above.  Willeen Niece, MD

## 2023-07-10 NOTE — Patient Instructions (Signed)
Counseling for BBL / IPL / Laser and Coordination of Care Discussed the treatment option of Broad Band Light (BBL) /Intense Pulsed Light (IPL)/ Laser for skin discoloration, including brown spots and redness.  Typically we recommend at least 1-3 treatment sessions about 5-8 weeks apart for best results.  Cannot have tanned skin when BBL performed, and regular use of sunscreen/photoprotection is advised after the procedure to help maintain results. The patient's condition may also require "maintenance treatments" in the future.  The fee for BBL / laser treatments is $350 per treatment session for the whole face.  A fee can be quoted for other parts of the body.  Insurance typically does not pay for BBL/laser treatments and therefore the fee is an out-of-pocket cost. Recommend prophylactic valtrex treatment. Once scheduled for procedure, will send Rx in prior to patient's appointment.     Due to recent changes in healthcare laws, you may see results of your pathology and/or laboratory studies on MyChart before the doctors have had a chance to review them. We understand that in some cases there may be results that are confusing or concerning to you. Please understand that not all results are received at the same time and often the doctors may need to interpret multiple results in order to provide you with the best plan of care or course of treatment. Therefore, we ask that you please give [place] 2 business days to thoroughly review all your results before contacting the office for clarification. Should we see a critical lab result, you will be contacted sooner.   If You Need Anything After Your Visit  If you have any questions or concerns for your doctor, please call our main line at [phone] and press option 4 to reach your doctor's medical assistant. If no one answers, please leave a voicemail as directed and we will return your call as soon as possible. Messages left after 4 pm will be answered the  following business day.   You may also send [place] a message via MyChart. We typically respond to MyChart messages within 1-2 business days.  For prescription refills, please ask your pharmacy to contact our office. Our fax number is [phone].  If you have an urgent issue when the clinic is closed that cannot wait until the next business day, you can page your doctor at the number below.    Please note that while we do our best to be available for urgent issues outside of office hours, we are not available 24/7.   If you have an urgent issue and are unable to reach [place], you may choose to seek medical care at your doctor's office, retail clinic, urgent care center, or emergency room.  If you have a medical emergency, please immediately call 911 or go to the emergency department.  Pager Numbers  - Dr. Gwen Pounds: [phone]  - Dr. Roseanne Reno: [phone]  - Dr. Katrinka Blazing: [phone]   In the event of inclement weather, please call our main line at [phone] for an update on the status of any delays or closures.  Dermatology Medication Tips: Please keep the boxes that topical medications come in in order to help keep track of the instructions about where and how to use these. Pharmacies typically print the medication instructions only on the boxes and not directly on the medication tubes.   If your medication is too expensive, please contact our office at [phone] option 4 or send [place] a message through MyChart.   We are unable to tell what  your co-pay for medications will be in advance as this is different depending on your insurance coverage. However, we may be able to find a substitute medication at lower cost or fill out paperwork to get insurance to cover a needed medication.   If a prior authorization is required to get your medication covered by your insurance company, please allow [place] 1-2 business days to complete this process.  Drug prices often vary depending on where the  prescription is filled and some pharmacies may offer cheaper prices.  The website www.goodrx.com contains coupons for medications through different pharmacies. The prices here do not account for what the cost may be with help from insurance (it may be cheaper with your insurance), but the website can give you the price if you did not use any insurance.  - You can print the associated coupon and take it with your prescription to the pharmacy.  - You may also stop by our office during regular business hours and pick up a GoodRx coupon card.  - If you need your prescription sent electronically to a different pharmacy, notify our office through [facility] or by phone at [phone] option 4.     Si Usted Necesita Algo Despus de Su Visita  Tambin puede enviarnos un mensaje a travs de Clinical cytogeneticist. Por lo general respondemos a los mensajes de MyChart en el transcurso de 1 a 2 das hbiles.  Para renovar recetas, por favor pida a su farmacia que se ponga en contacto con nuestra oficina. Annie Sable de fax es [place] [phone].  Si tiene un asunto urgente cuando la clnica est cerrada y que no puede esperar hasta el siguiente da hbil, puede llamar/localizar a su doctor(a) al nmero que aparece a continuacin.   Por favor, tenga en cuenta que aunque hacemos todo lo posible para estar disponibles para asuntos urgentes fuera del horario de [place], no estamos disponibles las 24 horas del da, los 7 [address]  [address] de la [place].   Si tiene un problema urgente y no puede comunicarse con nosotros, puede optar por buscar atencin mdica  en el consultorio de su doctor(a), en una clnica privada, en un centro de atencin urgente o en una sala de emergencias.  Si tiene Engineer, drilling, por favor llame inmediatamente al 911 o vaya a la sala de emergencias.  Nmeros de bper  - Dr. Gwen Pounds: [phone]  - Dra. Roseanne Reno: [phone]  - Dr. Katrinka Blazing: [phone]   En caso de inclemencias del  tiempo, por favor llame a Lacy Duverney principal al [phone] para una actualizacin sobre el [place] de cualquier retraso o cierre.  Consejos para la medicacin en dermatologa: Por favor, guarde las cajas en las que vienen los medicamentos de uso tpico para ayudarle a seguir las instrucciones sobre dnde y cmo usarlos. Las farmacias generalmente imprimen las instrucciones del medicamento slo en las cajas y no directamente en los tubos del [place].   Si su medicamento es muy caro, por favor, pngase en contacto con Rolm Gala llamando al [phone] y presione la opcin 4 o envenos un mensaje a travs de Clinical cytogeneticist.   No podemos decirle cul ser su copago por los medicamentos por adelantado ya que esto es diferente dependiendo de la cobertura de su seguro. Sin embargo, es posible que podamos encontrar un medicamento sustituto a Audiological scientist un formulario para que el seguro cubra el medicamento que se considera necesario.   Si se requiere una autorizacin previa para que su compaa de seguros [place] su  medicamento, por favor permtanos de 1 a 2 das hbiles para completar [address]  Los precios de los medicamentos varan con frecuencia dependiendo del Environmental consultant de dnde se surte la receta y alguna farmacias pueden ofrecer precios ms baratos.  El sitio web www.goodrx.com tiene cupones para medicamentos de Health and safety inspector. Los precios aqu no tienen en cuenta lo que podra costar con la ayuda del seguro (puede ser ms barato con su seguro), pero el sitio web puede darle el precio si no utiliz Tourist information centre manager.  - Puede imprimir el cupn correspondiente y llevarlo con su receta a la farmacia.  - Tambin puede pasar por nuestra oficina durante el horario de atencin regular y Education officer, museum una tarjeta de cupones de GoodRx.  - Si necesita que su receta se enve electrnicamente a una farmacia diferente, informe a nuestra oficina a travs de MyChart de [place] o por telfono  llamando al [phone] y presione la opcin 4.

## 2023-07-11 ENCOUNTER — Telehealth: Payer: Self-pay

## 2023-07-11 NOTE — Telephone Encounter (Signed)
Talked to patient and she is doing fine from procedure yesterday. She will call with any questions or concerns.

## 2023-07-31 ENCOUNTER — Ambulatory Visit
Admission: RE | Admit: 2023-07-31 | Discharge: 2023-07-31 | Disposition: A | Discharge status: Home / Self Care | Payer: Self-pay | Source: Ambulatory Visit | Attending: Internal Medicine | Admitting: Internal Medicine

## 2023-07-31 DIAGNOSIS — I10 Essential (primary) hypertension: Secondary | ICD-10-CM | POA: Insufficient documentation

## 2023-07-31 DIAGNOSIS — Z8249 Family history of ischemic heart disease and other diseases of the circulatory system: Secondary | ICD-10-CM | POA: Insufficient documentation

## 2023-07-31 DIAGNOSIS — E7800 Pure hypercholesterolemia, unspecified: Secondary | ICD-10-CM | POA: Insufficient documentation

## 2023-07-31 DIAGNOSIS — Z0000 Encounter for general adult medical examination without abnormal findings: Secondary | ICD-10-CM | POA: Insufficient documentation

## 2023-08-20 ENCOUNTER — Ambulatory Visit
Admission: RE | Admit: 2023-08-20 | Discharge: 2023-08-20 | Disposition: A | Discharge status: Home / Self Care | Payer: BC Managed Care – PPO | Source: Ambulatory Visit | Attending: Internal Medicine | Admitting: Internal Medicine

## 2023-08-20 DIAGNOSIS — Z1231 Encounter for screening mammogram for malignant neoplasm of breast: Secondary | ICD-10-CM | POA: Diagnosis present

## 2023-09-10 ENCOUNTER — Other Ambulatory Visit: Payer: Self-pay | Admitting: Obstetrics and Gynecology

## 2023-09-25 ENCOUNTER — Inpatient Hospital Stay: Admission: RE | Admit: 2023-09-25 | Payer: BC Managed Care – PPO | Source: Ambulatory Visit

## 2023-10-03 ENCOUNTER — Encounter: Admission: RE | Payer: Self-pay | Source: Home / Self Care

## 2023-10-03 ENCOUNTER — Ambulatory Visit
Admission: RE | Admit: 2023-10-03 | Payer: BC Managed Care – PPO | Source: Home / Self Care | Admitting: Obstetrics and Gynecology

## 2023-10-03 SURGERY — DILATATION AND CURETTAGE /HYSTEROSCOPY
Anesthesia: Choice

## 2023-10-16 ENCOUNTER — Emergency Department

## 2023-10-16 ENCOUNTER — Encounter: Payer: Self-pay | Admitting: Intensive Care

## 2023-10-16 ENCOUNTER — Emergency Department
Admission: EM | Admit: 2023-10-16 | Discharge: 2023-10-16 | Disposition: A | Discharge status: Home / Self Care | Attending: Emergency Medicine | Admitting: Emergency Medicine

## 2023-10-16 ENCOUNTER — Other Ambulatory Visit: Payer: Self-pay

## 2023-10-16 DIAGNOSIS — I10 Essential (primary) hypertension: Secondary | ICD-10-CM | POA: Diagnosis not present

## 2023-10-16 DIAGNOSIS — R519 Headache, unspecified: Secondary | ICD-10-CM | POA: Diagnosis present

## 2023-10-16 HISTORY — DX: Migraine, unspecified, not intractable, without status migrainosus: G43.909

## 2023-10-16 LAB — CBC WITH DIFFERENTIAL/PLATELET
Abs Immature Granulocytes: 0.03 10*3/uL (ref 0.00–0.07)
Basophils Absolute: 0 10*3/uL (ref 0.0–0.1)
Basophils Relative: 0 %
Eosinophils Absolute: 0.2 10*3/uL (ref 0.0–0.5)
Eosinophils Relative: 3 %
HCT: 43.7 % (ref 36.0–46.0)
Hemoglobin: 14.9 g/dL (ref 12.0–15.0)
Immature Granulocytes: 0 %
Lymphocytes Relative: 18 %
Lymphs Abs: 1.5 10*3/uL (ref 0.7–4.0)
MCH: 31.1 pg (ref 26.0–34.0)
MCHC: 34.1 g/dL (ref 30.0–36.0)
MCV: 91.2 fL (ref 80.0–100.0)
Monocytes Absolute: 0.6 10*3/uL (ref 0.1–1.0)
Monocytes Relative: 7 %
Neutro Abs: 5.9 10*3/uL (ref 1.7–7.7)
Neutrophils Relative %: 72 %
Platelets: 238 10*3/uL (ref 150–400)
RBC: 4.79 MIL/uL (ref 3.87–5.11)
RDW: 12.3 % (ref 11.5–15.5)
WBC: 8.3 10*3/uL (ref 4.0–10.5)
nRBC: 0 % (ref 0.0–0.2)

## 2023-10-16 LAB — COMPREHENSIVE METABOLIC PANEL
ALT: 16 U/L (ref 0–44)
AST: 19 U/L (ref 15–41)
Albumin: 4.1 g/dL (ref 3.5–5.0)
Alkaline Phosphatase: 65 U/L (ref 38–126)
Anion gap: 9 (ref 5–15)
BUN: 17 mg/dL (ref 6–20)
CO2: 23 mmol/L (ref 22–32)
Calcium: 8.7 mg/dL — ABNORMAL LOW (ref 8.9–10.3)
Chloride: 105 mmol/L (ref 98–111)
Creatinine, Ser: 0.68 mg/dL (ref 0.44–1.00)
GFR, Estimated: >60 mL/min (ref >60)
Glucose, Bld: 105 mg/dL — ABNORMAL HIGH (ref 70–99)
Potassium: 3.9 mmol/L (ref 3.5–5.1)
Sodium: 137 mmol/L (ref 135–145)
Total Bilirubin: 0.7 mg/dL (ref 0.0–1.2)
Total Protein: 7.2 g/dL (ref 6.5–8.1)

## 2023-10-16 LAB — TROPONIN I (HIGH SENSITIVITY): Troponin I (High Sensitivity): <2 ng/L (ref <18)

## 2023-10-16 MED ORDER — SODIUM CHLORIDE 0.9 % IV BOLUS
1000.0000 mL | Freq: Once | INTRAVENOUS | Status: AC
  Administered 2023-10-16: 1000 mL via INTRAVENOUS

## 2023-10-16 MED ORDER — DIPHENHYDRAMINE HCL 50 MG/ML IJ SOLN
25.0000 mg | Freq: Once | INTRAMUSCULAR | Status: AC
  Administered 2023-10-16: 25 mg via INTRAVENOUS
  Filled 2023-10-16: qty 1

## 2023-10-16 MED ORDER — AMLODIPINE BESYLATE 5 MG PO TABS: 5.0000 mg | ORAL_TABLET | Freq: Every day | ORAL | 0 refills | Status: AC

## 2023-10-16 MED ORDER — DEXAMETHASONE SODIUM PHOSPHATE 10 MG/ML IJ SOLN
10.0000 mg | Freq: Once | INTRAMUSCULAR | Status: AC
  Administered 2023-10-16: 10 mg via INTRAVENOUS
  Filled 2023-10-16: qty 1

## 2023-10-16 MED ORDER — PROCHLORPERAZINE EDISYLATE 10 MG/2ML IJ SOLN
10.0000 mg | Freq: Once | INTRAMUSCULAR | Status: AC
  Administered 2023-10-16: 10 mg via INTRAVENOUS
  Filled 2023-10-16: qty 2

## 2023-10-16 MED ORDER — ACETAMINOPHEN 500 MG PO TABS
1000.0000 mg | ORAL_TABLET | Freq: Once | ORAL | Status: AC
  Administered 2023-10-16: 1000 mg via ORAL
  Filled 2023-10-16: qty 2

## 2023-10-16 NOTE — ED Provider Notes (Signed)
 [facility] Provider Note    Event Date/Time   First MD Initiated Contact with Patient 10/16/23 1035     (approximate)   History   Migraine   HPI  Kristi Dominguez is a 58 year old female with history of migraines presenting to the emergency department for evaluation of headache.  A few days ago, patient had onset of a frontal headache, not sudden in onset or worst headache of life.  However, headache has persisted since that time despite taking over-the-counter medicine and Imitrex.  Initially felt like it may be a sinus headache.  No associated numbness, tingling, focal weakness.  No vision changes.  Noted to be hypertensive in triage, reports brief episode of chest pain 2 days ago otherwise no chest pain or shortness of breath.     Physical Exam   Triage Vital Signs: ED Triage Vitals  Encounter Vitals Group     BP 10/16/23 1018 (!) 208/128     Systolic BP Percentile --      Diastolic BP Percentile --      Pulse Rate 10/16/23 1018 97     Resp 10/16/23 1018 16     Temp 10/16/23 1018 98 F (36.7 C)     Temp Source 10/16/23 1018 Oral     SpO2 10/16/23 1018 100 %     Weight 10/16/23 1014 185 lb (83.9 kg)     Height 10/16/23 1014 5\' 7"  (1.702 m)     Head Circumference --      Peak Flow --      Pain Score 10/16/23 1014 7     Pain Loc --      Pain Education --      Exclude from Growth Chart --     Most recent vital signs: Vitals:   10/16/23 1300 10/16/23 1330  BP: (!) 160/93 (!) 167/90  Pulse: 65 66  Resp:    Temp:    SpO2: 94% 97%     General: Awake, interactive  CV:  Regular rate, good peripheral perfusion.  Resp:  Unlabored respirations, lungs clear to auscultation Abd:  Nondistended Neuro:  Symmetric facial movement, fluid speech, 5-5 strength in the bilateral upper and lower extremities   ED Results / Procedures / Treatments   Labs (all labs ordered are listed, but only abnormal results are displayed) Labs Reviewed   COMPREHENSIVE METABOLIC PANEL - Abnormal; Notable for the following components:      Result Value   Glucose, Bld 105 (*)    Calcium 8.7 (*)    All other components within normal limits  CBC WITH DIFFERENTIAL/PLATELET  TROPONIN I (HIGH SENSITIVITY)     EKG EKG independently reviewed interpreted by myself (ER attending) demonstrates:  EKG demonstrates normal sinus rhythm rate of 83, PR 138, QRS 82, QTc 14, no acute ST changes  RADIOLOGY Imaging independently reviewed and interpreted by myself demonstrates:  CXR without focal consolidation CT head without acute bleed, demonstrates area of encephalomalacia possibly related to prior infarct or trauma  PROCEDURES:  Critical Care performed: No  Procedures   MEDICATIONS ORDERED IN ED: Medications  dexamethasone (DECADRON) injection 10 mg (has no administration in time range)  acetaminophen (TYLENOL) tablet 1,000 mg (1,000 mg Oral Given 10/16/23 1059)  diphenhydrAMINE (BENADRYL) injection 25 mg (25 mg Intravenous Given 10/16/23 1121)  prochlorperazine (COMPAZINE) injection 10 mg (10 mg Intravenous Given 10/16/23 1121)  sodium chloride 0.9 % bolus 1,000 mL (1,000 mLs Intravenous New Bag/Given 10/16/23 1059)  IMPRESSION / MDM / ASSESSMENT AND PLAN / ED COURSE  I reviewed the triage vital signs and the nursing notes.  Differential diagnosis includes, but is not limited to, benign headache, intracranial bleed, lower suspicion SAH as the headache is not sudden onset or different than prior, consideration for hypertensive emergency, low suspicion ACS, pneumonia, pneumothorax  Patient's presentation is most consistent with acute presentation with potential threat to life or bodily function.  58 year old female presenting to the emergency department for evaluation of headache, noted to be hypertensive on presentation, improved without intervention.  Labs ordered from triage overall reassuring.  Reassuring EKG and troponin.  CXR without  acute abnormalities.  Head CT without acute findings.  Discussed area of encephalomalacia, patient has a primary care doctor and follow-up as an outpatient.  She was treated with headache cocktail with Tylenol, Benadryl, Compazine, IV fluids.  Did feel much improved on reevaluation.  She is comfortable with discharge home and outpatient follow-up.  Will give a dose of steroids to prevent headache recurrence.  Strict return precautions provided.  Patient discharged stable condition.      FINAL CLINICAL IMPRESSION(S) / ED DIAGNOSES   Final diagnoses:  Acute nonintractable headache, unspecified headache type  Uncontrolled hypertension     Rx / DC Orders   ED Discharge Orders          Ordered    amLODipine (NORVASC) 5 MG tablet  Daily        10/16/23 1334             Note:  This document was prepared using Dragon voice recognition software and may include unintentional dictation errors.   Trinna Post, MD 10/16/23 1336

## 2023-10-16 NOTE — ED Triage Notes (Signed)
 Patient c/o migraine for days and taking her prescribed medications with OTC and no relief.   Denies vision changes. Denies chest pain or sob

## 2023-10-16 NOTE — ED Notes (Signed)
 Patient discharged from ED by provider. Discharge instructions reviewed with patient and all questions answered. Patient ambulatory from ED in NAD.

## 2023-10-16 NOTE — ED Notes (Signed)
 Patient transported to CT

## 2023-10-16 NOTE — Discharge Instructions (Signed)
 You were seen in the ER today for your headache.  We fortunately did not find an emergency cause for this.  Your blood pressure was also high.  I sent a prescription for blood pressure medicine to your pharmacy.  Keep a log of your blood pressure medications and take this in with you to your primary care doctor at your follow-up appointment.  Return to the ER for new or worsening symptoms.

## 2024-01-23 ENCOUNTER — Other Ambulatory Visit: Payer: Self-pay | Admitting: Neurology

## 2024-01-23 DIAGNOSIS — G9389 Other specified disorders of brain: Secondary | ICD-10-CM

## 2024-01-28 ENCOUNTER — Ambulatory Visit
Admission: RE | Admit: 2024-01-28 | Discharge: 2024-01-28 | Disposition: A | Discharge status: Home / Self Care | Source: Ambulatory Visit | Attending: Neurology | Admitting: Neurology

## 2024-01-28 DIAGNOSIS — G9389 Other specified disorders of brain: Secondary | ICD-10-CM

## 2024-01-28 MED ORDER — GADOPICLENOL 0.5 MMOL/ML IV SOLN
10.0000 mL | Freq: Once | INTRAVENOUS | Status: AC | PRN
  Administered 2024-01-28: 8 mL via INTRAVENOUS

## 2024-01-30 ENCOUNTER — Other Ambulatory Visit

## 2024-05-04 ENCOUNTER — Other Ambulatory Visit: Payer: Self-pay | Admitting: Internal Medicine

## 2024-05-04 DIAGNOSIS — Z1231 Encounter for screening mammogram for malignant neoplasm of breast: Secondary | ICD-10-CM

## 2024-06-15 ENCOUNTER — Ambulatory Visit: Payer: 59 | Admitting: Dermatology

## 2024-06-15 DIAGNOSIS — D225 Melanocytic nevi of trunk: Secondary | ICD-10-CM

## 2024-06-15 DIAGNOSIS — L814 Other melanin hyperpigmentation: Secondary | ICD-10-CM | POA: Diagnosis not present

## 2024-06-15 DIAGNOSIS — D229 Melanocytic nevi, unspecified: Secondary | ICD-10-CM

## 2024-06-15 DIAGNOSIS — L304 Erythema intertrigo: Secondary | ICD-10-CM

## 2024-06-15 DIAGNOSIS — Z1283 Encounter for screening for malignant neoplasm of skin: Secondary | ICD-10-CM

## 2024-06-15 DIAGNOSIS — D2262 Melanocytic nevi of left upper limb, including shoulder: Secondary | ICD-10-CM

## 2024-06-15 DIAGNOSIS — L578 Other skin changes due to chronic exposure to nonionizing radiation: Secondary | ICD-10-CM

## 2024-06-15 DIAGNOSIS — D1801 Hemangioma of skin and subcutaneous tissue: Secondary | ICD-10-CM

## 2024-06-15 DIAGNOSIS — Z86018 Personal history of other benign neoplasm: Secondary | ICD-10-CM

## 2024-06-15 DIAGNOSIS — L820 Inflamed seborrheic keratosis: Secondary | ICD-10-CM

## 2024-06-15 DIAGNOSIS — W908XXA Exposure to other nonionizing radiation, initial encounter: Secondary | ICD-10-CM | POA: Diagnosis not present

## 2024-06-15 DIAGNOSIS — R238 Other skin changes: Secondary | ICD-10-CM

## 2024-06-15 DIAGNOSIS — D2371 Other benign neoplasm of skin of right lower limb, including hip: Secondary | ICD-10-CM

## 2024-06-15 DIAGNOSIS — D239 Other benign neoplasm of skin, unspecified: Secondary | ICD-10-CM

## 2024-06-15 DIAGNOSIS — L821 Other seborrheic keratosis: Secondary | ICD-10-CM

## 2024-06-15 MED ORDER — KETOCONAZOLE 2 % EX CREA
TOPICAL_CREAM | CUTANEOUS | 11 refills | Status: AC
Start: 2024-06-15 — End: ?

## 2024-06-15 NOTE — Patient Instructions (Addendum)
 Recommend OTC Zeasorb AF powder to body folds daily after shower.  It is often found in the athlete's foot section in the pharmacy.  Avoid using powders that contain cornstarch.   Due to recent changes in healthcare laws, you may see results of your pathology and/or laboratory studies on MyChart before the doctors have had a chance to review them. We understand that in some cases there may be results that are confusing or concerning to you. Please understand that not all results are received at the same time and often the doctors may need to interpret multiple results in order to provide you with the best plan of care or course of treatment. Therefore, we ask that you please give us  2 business days to thoroughly review all your results before contacting the office for clarification. Should we see a critical lab result, you will be contacted sooner.   If You Need Anything After Your Visit  If you have any questions or concerns for your doctor, please call our main line at [phone] and press option 4 to reach your doctor's medical assistant. If no one answers, please leave a voicemail as directed and we will return your call as soon as possible. Messages left after 4 pm will be answered the following business day.   You may also send us  a message via MyChart. We typically respond to MyChart messages within 1-2 business days.  For prescription refills, please ask your pharmacy to contact our office. Our fax number is [phone].  If you have an urgent issue when the clinic is closed that cannot wait until the next business day, you can page your doctor at the number below.    Please note that while we do our best to be available for urgent issues outside of office hours, we are not available 24/7.   If you have an urgent issue and are unable to reach us , you may choose to seek medical care at your doctor's office, retail clinic, urgent care center, or emergency room.  If you have a medical  emergency, please immediately call 911 or go to the emergency department.  Pager Numbers  - Dr. Hester: [phone]  - Dr. Jackquline: [phone]  - Dr. Claudene: [phone]   - Dr. Raymund: [phone]  In the event of inclement weather, please call our main line at [phone] for an update on the status of any delays or closures.  Dermatology Medication Tips: Please keep the boxes that topical medications come in in order to help keep track of the instructions about where and how to use these. Pharmacies typically print the medication instructions only on the boxes and not directly on the medication tubes.   If your medication is too expensive, please contact our office at [phone] option 4 or send us  a message through MyChart.   We are unable to tell what your co-pay for medications will be in advance as this is different depending on your insurance coverage. However, we may be able to find a substitute medication at lower cost or fill out paperwork to get insurance to cover a needed medication.   If a prior authorization is required to get your medication covered by your insurance company, please allow us  1-2 business days to complete this process.  Drug prices often vary depending on where the prescription is filled and some pharmacies may offer cheaper prices.  The website www.goodrx.com contains coupons for medications through different pharmacies. The prices here do not account for what the cost may  be with help from insurance (it may be cheaper with your insurance), but the website can give you the price if you did not use any insurance.  - You can print the associated coupon and take it with your prescription to the pharmacy.  - You may also stop by our office during regular business hours and pick up a GoodRx coupon card.  - If you need your prescription sent electronically to a different pharmacy, notify our office through [facility] or by phone at [phone]  option 4.     Si Usted Necesita Algo Despus de Su Visita  Tambin puede enviarnos un mensaje a travs de Clinical Cytogeneticist. Por lo general respondemos a los mensajes de MyChart en el transcurso de 1 a 2 das hbiles.  Para renovar recetas, por favor pida a su farmacia que se ponga en contacto con nuestra oficina. Randi lakes de fax es [place] [phone].  Si tiene un asunto urgente cuando la clnica est cerrada y que no puede esperar hasta el siguiente da hbil, puede llamar/localizar a su doctor(a) al nmero que aparece a continuacin.   Por favor, tenga en cuenta que aunque hacemos todo lo posible para estar disponibles para asuntos urgentes fuera del horario de [place], no estamos disponibles las 24 horas del da, los 7 [address]  [address] de la [place].   Si tiene un problema urgente y no puede comunicarse con nosotros, puede optar por buscar atencin mdica  en el consultorio de su doctor(a), en una clnica privada, en un centro de atencin urgente o en una sala de emergencias.  Si tiene engineer, drilling, por favor llame inmediatamente al 911 o vaya a la sala de emergencias.  Nmeros de bper  - Dr. Hester: [phone]  - Dra. Jackquline: [phone]  - Dr. Claudene: [phone]  - Dra. Kitts: [phone]  En caso de inclemencias del [place], por favor llame a nuestra lnea principal al [phone] para una actualizacin sobre el estado de cualquier retraso o cierre.  Consejos para la medicacin en dermatologa: Por favor, guarde las cajas en las que vienen los medicamentos de uso tpico para ayudarle a seguir las instrucciones sobre dnde y cmo usarlos. Las farmacias generalmente imprimen las instrucciones del medicamento slo en las cajas y no directamente en los tubos del [place].   Si su medicamento es muy caro, por favor, pngase en contacto con landry rieger llamando al [phone] y presione la opcin 4 o envenos un mensaje a travs de Clinical Cytogeneticist.   No podemos decirle cul ser su  copago por los medicamentos por adelantado ya que esto es diferente dependiendo de la cobertura de su seguro. Sin embargo, es posible que podamos encontrar un medicamento sustituto a audiological scientist un formulario para que el seguro cubra el medicamento que se considera necesario.   Si se requiere una autorizacin previa para que su compaa de seguros cubra su medicamento, por favor permtanos de 1 a 2 das hbiles para completar este proceso.  Los precios de los medicamentos varan con frecuencia dependiendo del environmental consultant de dnde se surte la receta y alguna farmacias pueden ofrecer precios ms baratos.  El sitio web www.goodrx.com tiene cupones para medicamentos de health and safety inspector. Los precios aqu no tienen en cuenta lo que podra costar con la ayuda del seguro (puede ser ms barato con su seguro), pero el sitio web puede darle el precio si no utiliz tourist information centre manager.  - Puede imprimir el cupn correspondiente y llevarlo con su receta a la farmacia.  - Tambin  puede pasar por nuestra oficina durante el horario de atencin regular y education officer, museum una tarjeta de cupones de GoodRx.  - Si necesita que su receta se enve electrnicamente a una farmacia diferente, informe a nuestra oficina a travs de MyChart de [place] o por telfono llamando al [phone] y presione la opcin 4.

## 2024-06-15 NOTE — Progress Notes (Signed)
 Follow-Up Visit   Subjective  Kristi Dominguez is a 58 y.o. female who presents for the following: Skin Cancer Screening and Full Body Skin Exam  The patient presents for Total-Body Skin Exam (TBSE) for skin cancer screening and mole check. The patient has spots, moles and lesions to be evaluated, some may be new or changing. Spot to check on the forehead, right breast, right lower abdomen.    The following portions of the chart were reviewed this encounter and updated as appropriate: medications, allergies, medical history  Review of Systems:  No other skin or systemic complaints except as noted in HPI or Assessment and Plan.  Objective  Well appearing patient in no apparent distress; mood and affect are within normal limits.  A full examination was performed including scalp, head, eyes, ears, nose, lips, neck, chest, axillae, abdomen, back, buttocks, bilateral upper extremities, bilateral lower extremities, hands, feet, fingers, toes, fingernails, and toenails. All findings within normal limits unless otherwise noted below.   Relevant physical exam findings are noted in the Assessment and Plan.    Assessment & Plan   SKIN CANCER SCREENING PERFORMED TODAY.  ACTINIC DAMAGE - Chronic condition, secondary to cumulative UV/sun exposure - diffuse scaly erythematous macules with underlying dyspigmentation - Recommend daily broad spectrum sunscreen SPF 30+ to sun-exposed areas, reapply every 2 hours as needed.  - Staying in the shade or wearing long sleeves, sun glasses (UVA+UVB protection) and wide brim hats (4-inch brim around the entire circumference of the hat) are also recommended for sun protection.  - Call for new or changing lesions.  LENTIGINES, SEBORRHEIC KERATOSES (including left upper forehead, R lower abdomen), HEMANGIOMAS - Benign normal skin lesions - Benign-appearing - Call for any changes  INFLAMED SEBORRHEIC KERATOSIS VS IRRITATED NEVUS - 5 mm waxy pink tan papule  with central crusting R lateral breast - Benign-appearing. Discussed cryotherapy, patient defers today.  - Discussed benign etiology and prognosis. - Observe - Call for any changes  History of Dysplastic Nevi L upper med thigh, mod, 2021 R lat scapula, mod, 2021 - No evidence of recurrence today - Recommend regular full body skin exams - Recommend daily broad spectrum sunscreen SPF 30+ to sun-exposed areas, reapply every 2 hours as needed.  - Call if any new or changing lesions are noted between office visits  MELANOCYTIC NEVI - Tan-brown and/or pink-flesh-colored symmetric macules and papules - Left spinal lower back 4 x 3 mm regular medium dark brown macule. - left lower back at waistline 4 x 2.5 mm regular medium dark brown macule  - L upper arm 0.4 cm medium brown macule.  - L abdomen 0.4 cm medium dark brown macule  - Right lower back 7 x 4 mm medium dark brown macule lighter rim  - Benign appearing on exam today - Observation - Call clinic for new or changing moles - Recommend daily use of broad spectrum spf 30+ sunscreen to sun-exposed areas.   DERMATOFIBROMA Exam: Firm pink/brown papulenodule with dimple sign, right lateral thigh.  Treatment Plan: A dermatofibroma is a benign growth possibly related to trauma, such as an insect bite, cut from shaving, or inflamed acne-type bump.  Treatment options to remove include shave or excision with resulting scar and risk of recurrence.  Since benign-appearing and not bothersome, will observe for now.   INTERTRIGO Exam: Erythematous macerated patches in body folds with pink satellite papules  Chronic and persistent condition with duration or expected duration over one year. Condition is symptomatic/ bothersome to  patient. Not currently at goal.  Intertrigo is a chronic recurrent rash that occurs in skin fold areas that may be associated with friction; heat; moisture; yeast; fungus; and bacteria.  It is exacerbated by increased  movement / activity; sweating; and higher atmospheric temperature.  Use of an absorbant powder such as Zeasorb AF powder or other OTC antifungal powder to the area daily can prevent rash recurrence. Other options to help keep the area dry include blow drying the area after bathing or using antiperspirant products such as Duradry sweat minimizing gel.  Treatment Plan:  Continue Ketoconazole  2% cream to aa's Qhs and restart OTC Zeasorb AF powder daily.    Return in about 1 year (around 06/15/2025) for TBSE, Hx Dysplastic Nevus.  IAndrea Kerns, CMA, am acting as scribe for Rexene Rattler, MD .   Documentation: I have reviewed the above documentation for accuracy and completeness, and I agree with the above.  Rexene Rattler, MD

## 2024-08-07 ENCOUNTER — Encounter
Admission: RE | Admit: 2024-08-07 | Discharge: 2024-08-07 | Disposition: A | Discharge status: Home / Self Care | Source: Ambulatory Visit | Attending: Obstetrics and Gynecology | Admitting: Obstetrics and Gynecology

## 2024-08-07 ENCOUNTER — Other Ambulatory Visit: Payer: Self-pay

## 2024-08-07 DIAGNOSIS — Z01812 Encounter for preprocedural laboratory examination: Secondary | ICD-10-CM

## 2024-08-07 DIAGNOSIS — I10 Essential (primary) hypertension: Secondary | ICD-10-CM

## 2024-08-07 DIAGNOSIS — N920 Excessive and frequent menstruation with regular cycle: Secondary | ICD-10-CM

## 2024-08-07 DIAGNOSIS — Z01810 Encounter for preprocedural cardiovascular examination: Secondary | ICD-10-CM

## 2024-08-07 DIAGNOSIS — N939 Abnormal uterine and vaginal bleeding, unspecified: Secondary | ICD-10-CM | POA: Insufficient documentation

## 2024-08-07 HISTORY — DX: Leiomyoma of uterus, unspecified: D25.9

## 2024-08-07 HISTORY — DX: Personal history of other diseases of the female genital tract: Z87.42

## 2024-08-07 HISTORY — DX: Polyp of corpus uteri: N84.0

## 2024-08-07 HISTORY — DX: Essential (primary) hypertension: I10

## 2024-08-07 NOTE — H&P (Signed)
 " Ms. Kristi Dominguez is a 59 y.o. [id number] female here for referral for postmenopausal bleeding   Referring provider: Auston Reyes BIRCH, MD   History of Present Illness:     History of Present Illness Kristi Dominguez is a 59 year old female who presents with postmenopausal bleeding, though she may never had had 12 months without bleeding.   Postmenopausal vaginal bleeding - Heavy vaginal bleeding for the past two weeks, severe enough to prevent leaving the house - Only two episodes of bleeding since May prior to this current episode   Nipple soreness - Severe nipple soreness causing discomfort with wearing clothes   Prior gynecologic evaluation and imaging - Endometrial biopsy in January 2025 showed secretory endometrium without hyperplasia - Saline ultrasound in January 2025 revealed a 7 mm endometrial lesion, a fundal fibroid, and endometrial thickness of almost 2 cm   Hormonal laboratory findings - Laboratory results in January 2025 showed premenopausal-range hormones: E2 109, FSH 28, LH 15   Gynecologic procedures and management considerations - Scheduled for dilation and curettage (D&C) in March 2025 but canceled - Uncertain about candidacy for hormone replacement therapy and whether to pursue it         Results Labs E2 (07/2023): 109, within premenopausal range FSH (07/2023): 28, within premenopausal range LH (07/2023): 15, within premenopausal range   Diagnostic Saline sonogram (07/2023): Endometrial lesion 7 mm, fundal fibroid, endometrial thickness nearly 2 cm   Pathology Endometrial biopsy (07/2023): Secretory endometrium without hyperplasia       Past Medical History:  has a past medical history of Depression, Eczema, unspecified (04/06/2014), and Hyperlipidemia.  Past Surgical History:  has a past surgical history that includes Tonsillectomy; Appendectomy; and Colonoscopy. Family History: family history includes Asthma in her son and son; Coronary Artery Disease  (Blocked arteries around heart) in her father, maternal grandfather, mother, and paternal grandfather; Depression in her father, mother, and sister; Diabetes type II in her maternal grandmother and mother; High blood pressure (Hypertension) in her mother; No Known Problems in her daughter; Obesity in her sister; Other in her son and son; Prostate cancer in her father. Social History:  reports that she has never smoked. She has never used smokeless tobacco. She reports that she does not drink alcohol and does not use drugs. OB/GYN History:  OB History       Gravida  3   Para  3   Term  3   Preterm      AB      Living  3        SAB      IAB      Ectopic      Molar      Multiple      Live Births  3           Allergies: is allergic to tetracycline. Medications: Current Medications  Current Outpatient Medications:    ergocalciferol , vitamin D2, 1,250 mcg (50,000 unit) capsule, Take 50,000 Units by mouth once a week, Disp: , Rfl:    propranoloL (INDERAL LA) 60 MG LA capsule, Take 1 capsule (60 mg total) by mouth once daily, Disp: 90 capsule, Rfl: 3   rosuvastatin (CRESTOR) 10 MG tablet, Take 1 tablet (10 mg total) by mouth once daily, Disp: 90 tablet, Rfl: 3   SUMAtriptan  (IMITREX ) 100 MG tablet, TAKE ONE TABLET BY MOUTH ONCE AS NEEDED FOR MIGRAINE FOR UP TO 1 DOSE, Disp: 9 tablet, Rfl: 1   venlafaxine  (EFFEXOR -XR) 150 MG XR capsule,  TAKE 1 CAPSULE BY MOUTH ONCE DAILY, Disp: 30 capsule, Rfl: 1     Review of Systems: No SOB, no palpitations or chest pain, no new lower extremity edema, no nausea or vomiting or bowel or bladder complaints. See HPI for gyn specific ROS.    Exam:       Vitals:    07/20/24 1610  BP: (!) 160/86  Pulse: 86        Constitutional:  General appearance: Well nourished, well developed female in no acute distress.  Neuro/psych:  Normal mood and affect. No gross motor deficits. Neck:  Supple, normal appearance.  Respiratory:  Normal  respiratory effort, no use of accessory muscles Skin:  No visible rashes or external lesions     Pelvic:  deferred     Impression:    The encounter diagnosis was PMB (postmenopausal bleeding).   Plan:    -  Assessment & Plan Abnormal Uterine Bleeding Heavy and irregular postmenopausal bleeding for the last two weeks, impacting daily activities. Previous endometrial biopsy showed secretory endometrium without hyperplasia. Saline sonogram revealed a 7 mm endometrial lesion and a fundal fibroid with an endometrial thickness of almost 2 cm. Differential diagnosis includes hormonal imbalance versus structural causes such as polyps or fibroids. - Scheduled D&C with hysteroscopy and expected polypectomy to address bleeding and obtain biopsy material. - Advised that the procedure is same-day with minimal recovery time, but requires someone to drive her home due to anesthesia.   Endometrial Polyp Presence of a 7 mm endometrial lesion identified on saline sonogram, likely a polyp. Polyps can cause abnormal bleeding and may not resolve spontaneously. - Will perform D&C with hysteroscopy and polypectomy to remove the polyp and obtain biopsy material.   Uterine Fibroid Fundal fibroid identified on saline sonogram. Fibroids can contribute to abnormal uterine bleeding. - Will evaluate fibroid during D&C with hysteroscopy.   Perimenopausal Symptoms Symptoms consistent with perimenopause, including irregular periods and sore nipples. Hormonal levels from January 2025 indicated premenopausal ranges. - Ordered labs to assess current hormonal status. "

## 2024-08-07 NOTE — Patient Instructions (Addendum)
 Your procedure is scheduled on:08/14/24 - Friday Report to the Registration Desk on the 1st floor of the [facility]. To find out your arrival time, please call [phone] between 1PM - 3PM on: 08/13/24 - Thursday If your arrival time is 6:00 am, do not arrive before that time as the [facility] entrance doors do not open until 6:00 am.  REMEMBER: Instructions that are not followed completely may result in serious medical risk, up to and including death; or upon the discretion of your surgeon and anesthesiologist your surgery may need to be rescheduled.  Do not eat food after midnight the night before surgery.  No gum chewing or hard candies.  You may however, drink CLEAR liquids up to 2 hours before you are scheduled to arrive for your surgery. Do not drink anything within 2 hours of your scheduled arrival time.  Clear liquids include: - water  - apple juice without pulp - gatorade (not RED colors) - black coffee or tea (Do NOT add milk or creamers to the coffee or tea) Do NOT drink anything that is not on this list.  One week prior to surgery: Stop Anti-inflammatories (NSAIDS) such as Advil, Aleve, Ibuprofen, Motrin, Naproxen, Naprosyn and Aspirin  based products such as Excedrin, Goody's Powder, BC Powder. You may take Tylenol  if needed for pain up until the day of surgery.  Stop ANY OVER THE COUNTER supplements until after surgery.  ON THE DAY OF SURGERY ONLY TAKE THESE MEDICATIONS WITH SIPS OF WATER:  propranolol ER (INDERAL LA)  venlafaxine  XR (EFFEXOR -XR)    No Alcohol for 24 hours before or after surgery.  No Smoking including e-cigarettes for 24 hours before surgery.  No chewable tobacco products for at least 6 hours before surgery.  No nicotine patches on the day of surgery.  Do not use any recreational drugs for at least a week (preferably 2 weeks) before your surgery.  Please be advised that the combination of cocaine and anesthesia may have negative outcomes,  up to and including death. If you test positive for cocaine, your surgery will be cancelled.  On the morning of surgery brush your teeth with toothpaste and water, you may rinse your mouth with mouthwash if you wish. Do not swallow any toothpaste or mouthwash.  Do not wear jewelry, make-up, hairpins, clips or nail polish.  For welded (permanent) jewelry: bracelets, anklets, waist bands, etc.  Please have this removed prior to surgery.  If it is not removed, there is a chance that hospital personnel will need to cut it off on the day of surgery.  Do not wear lotions, powders, or perfumes.   Do not shave body hair from the neck down 48 hours before surgery.  Contact lenses, hearing aids and dentures may not be worn into surgery.  Do not bring valuables to the hospital. [facility] is not responsible for any missing/lost belongings or valuables.   Notify your doctor if there is any change in your medical condition (cold, fever, infection).  Wear comfortable clothing (specific to your surgery type) to the hospital.  After surgery, you can help prevent lung complications by doing breathing exercises.  Take deep breaths and cough every 1-2 hours. Your doctor may order a device called an Incentive Spirometer to help you take deep breaths.  When coughing or sneezing, hold a pillow firmly against your incision with both hands. This is called splinting. Doing this helps protect your incision. It also decreases belly discomfort.  If you are being admitted  to the hospital overnight, leave your suitcase in the car. After surgery it may be brought to your room.  In case of increased patient census, it may be necessary for you, the patient, to continue your postoperative care in the Same Day Surgery department.  If you are being discharged the day of surgery, you will not be allowed to drive home. You will need a responsible individual to drive you home and stay with you for 24 hours after  surgery.   If you are taking public transportation, you will need to have a responsible individual with you.  Please call the Pre-admissions Testing Dept. at [phone] if you have any questions about these instructions.  Surgery Visitation Policy:  Patients having surgery or a procedure may have two visitors.  Children under the age of 68 must have an adult with them who is not the patient.  Inpatient Visitation:    Visiting hours are 7 a.m. to 8 p.m. Up to four visitors are allowed at one time in a patient room. The visitors may rotate out with other people during the day.  One visitor age 23 or older may stay with the patient overnight and must be in the room by 8 p.m.   Merchandiser, Retail to address health-related social needs:  [email or web address]

## 2024-08-11 ENCOUNTER — Encounter
Admission: RE | Admit: 2024-08-11 | Discharge: 2024-08-11 | Disposition: A | Source: Ambulatory Visit | Attending: Obstetrics and Gynecology | Admitting: Obstetrics and Gynecology

## 2024-08-11 DIAGNOSIS — Z01818 Encounter for other preprocedural examination: Secondary | ICD-10-CM | POA: Insufficient documentation

## 2024-08-11 DIAGNOSIS — Z01810 Encounter for preprocedural cardiovascular examination: Secondary | ICD-10-CM

## 2024-08-11 DIAGNOSIS — I10 Essential (primary) hypertension: Secondary | ICD-10-CM | POA: Insufficient documentation

## 2024-08-11 DIAGNOSIS — N950 Postmenopausal bleeding: Secondary | ICD-10-CM | POA: Diagnosis present

## 2024-08-11 DIAGNOSIS — Z01812 Encounter for preprocedural laboratory examination: Secondary | ICD-10-CM

## 2024-08-11 DIAGNOSIS — N840 Polyp of corpus uteri: Secondary | ICD-10-CM | POA: Diagnosis not present

## 2024-08-11 LAB — BASIC METABOLIC PANEL WITH GFR
Anion gap: 10 (ref 5–15)
BUN: 12 mg/dL (ref 6–20)
CO2: 23 mmol/L (ref 22–32)
Calcium: 8.9 mg/dL (ref 8.9–10.3)
Chloride: 105 mmol/L (ref 98–111)
Creatinine, Ser: 0.78 mg/dL (ref 0.44–1.00)
GFR, Estimated: >60 mL/min
Glucose, Bld: 102 mg/dL — ABNORMAL HIGH (ref 70–99)
Potassium: 4.2 mmol/L (ref 3.5–5.1)
Sodium: 138 mmol/L (ref 135–145)

## 2024-08-11 LAB — CBC
HCT: 40.6 % (ref 36.0–46.0)
Hemoglobin: 14.1 g/dL (ref 12.0–15.0)
MCH: 31.3 pg (ref 26.0–34.0)
MCHC: 34.7 g/dL (ref 30.0–36.0)
MCV: 90 fL (ref 80.0–100.0)
Platelets: 219 K/uL (ref 150–400)
RBC: 4.51 MIL/uL (ref 3.87–5.11)
RDW: 12.4 % (ref 11.5–15.5)
WBC: 7.5 K/uL (ref 4.0–10.5)
nRBC: 0 % (ref 0.0–0.2)

## 2024-08-14 ENCOUNTER — Encounter: Payer: Self-pay | Admitting: Obstetrics and Gynecology

## 2024-08-14 ENCOUNTER — Ambulatory Visit: Admitting: Anesthesiology

## 2024-08-14 ENCOUNTER — Ambulatory Visit
Admission: RE | Admit: 2024-08-14 | Discharge: 2024-08-14 | Disposition: A | Discharge status: Home / Self Care | Attending: Obstetrics and Gynecology | Admitting: Obstetrics and Gynecology

## 2024-08-14 ENCOUNTER — Other Ambulatory Visit: Payer: Self-pay

## 2024-08-14 ENCOUNTER — Encounter
Admission: RE | Disposition: A | Discharge status: Home / Self Care | Payer: Self-pay | Source: Home / Self Care | Attending: Obstetrics and Gynecology

## 2024-08-14 DIAGNOSIS — N939 Abnormal uterine and vaginal bleeding, unspecified: Secondary | ICD-10-CM | POA: Insufficient documentation

## 2024-08-14 DIAGNOSIS — I10 Essential (primary) hypertension: Secondary | ICD-10-CM | POA: Insufficient documentation

## 2024-08-14 DIAGNOSIS — N840 Polyp of corpus uteri: Secondary | ICD-10-CM | POA: Diagnosis not present

## 2024-08-14 DIAGNOSIS — N950 Postmenopausal bleeding: Secondary | ICD-10-CM | POA: Insufficient documentation

## 2024-08-14 LAB — CBC
HCT: 37.7 % (ref 36.0–46.0)
Hemoglobin: 13.1 g/dL (ref 12.0–15.0)
MCH: 31.6 pg (ref 26.0–34.0)
MCHC: 34.7 g/dL (ref 30.0–36.0)
MCV: 91.1 fL (ref 80.0–100.0)
Platelets: 214 K/uL (ref 150–400)
RBC: 4.14 MIL/uL (ref 3.87–5.11)
RDW: 12.6 % (ref 11.5–15.5)
WBC: 5.2 K/uL (ref 4.0–10.5)
nRBC: 0 % (ref 0.0–0.2)

## 2024-08-14 MED ORDER — LEVONORGESTREL 20 MCG/DAY IU IUD
INTRAUTERINE_SYSTEM | INTRAUTERINE | Status: AC
  Filled 2024-08-14: qty 1

## 2024-08-14 MED ORDER — CHLORHEXIDINE GLUCONATE 0.12 % MT SOLN
OROMUCOSAL | Status: AC
  Filled 2024-08-14: qty 15

## 2024-08-14 MED ORDER — LACTATED RINGERS IV SOLN: INTRAVENOUS | Status: DC

## 2024-08-14 MED ORDER — DEXMEDETOMIDINE HCL IN NACL 80 MCG/20ML IV SOLN
INTRAVENOUS | Status: DC | PRN
  Administered 2024-08-14: 12 ug via INTRAVENOUS
  Administered 2024-08-14: 8 ug via INTRAVENOUS

## 2024-08-14 MED ORDER — EPHEDRINE SULFATE-NACL 50-0.9 MG/10ML-% IV SOSY
PREFILLED_SYRINGE | INTRAVENOUS | Status: DC | PRN
  Administered 2024-08-14: 15 mg via INTRAVENOUS

## 2024-08-14 MED ORDER — CHLORHEXIDINE GLUCONATE 0.12 % MT SOLN
15.0000 mL | Freq: Once | OROMUCOSAL | Status: AC
  Administered 2024-08-14: 15 mL via OROMUCOSAL

## 2024-08-14 MED ORDER — SILVER NITRATE-POT NITRATE 75-25 % EX MISC
CUTANEOUS | Status: AC
  Filled 2024-08-14: qty 10

## 2024-08-14 MED ORDER — DEXAMETHASONE SOD PHOSPHATE PF 10 MG/ML IJ SOLN
INTRAMUSCULAR | Status: AC
  Filled 2024-08-14: qty 1

## 2024-08-14 MED ORDER — ONDANSETRON HCL 4 MG/2ML IJ SOLN
INTRAMUSCULAR | Status: DC | PRN
  Administered 2024-08-14: 4 mg via INTRAVENOUS

## 2024-08-14 MED ORDER — MIDAZOLAM HCL 2 MG/2ML IJ SOLN
INTRAMUSCULAR | Status: AC
  Filled 2024-08-14: qty 2

## 2024-08-14 MED ORDER — DEXAMETHASONE SOD PHOSPHATE PF 10 MG/ML IJ SOLN
INTRAMUSCULAR | Status: DC | PRN
  Administered 2024-08-14: 10 mg via INTRAVENOUS

## 2024-08-14 MED ORDER — MIDAZOLAM HCL (PF) 2 MG/2ML IJ SOLN
INTRAMUSCULAR | Status: DC | PRN
  Administered 2024-08-14: 2 mg via INTRAVENOUS

## 2024-08-14 MED ORDER — ONDANSETRON HCL 4 MG/2ML IJ SOLN
INTRAMUSCULAR | Status: AC
  Filled 2024-08-14: qty 2

## 2024-08-14 MED ORDER — ACETAMINOPHEN 500 MG PO TABS
1000.0000 mg | ORAL_TABLET | ORAL | Status: AC
  Administered 2024-08-14: 1000 mg via ORAL

## 2024-08-14 MED ORDER — LIDOCAINE HCL (CARDIAC) PF 100 MG/5ML IV SOSY
PREFILLED_SYRINGE | INTRAVENOUS | Status: DC | PRN
  Administered 2024-08-14: 100 mg via INTRAVENOUS

## 2024-08-14 MED ORDER — LEVONORGESTREL 20 MCG/DAY IU IUD
1.0000 | INTRAUTERINE_SYSTEM | INTRAUTERINE | Status: AC
  Administered 2024-08-14: 1 via INTRAUTERINE

## 2024-08-14 MED ORDER — EPHEDRINE 5 MG/ML INJ
INTRAVENOUS | Status: AC
  Filled 2024-08-14: qty 5

## 2024-08-14 MED ORDER — DROPERIDOL 2.5 MG/ML IJ SOLN: 0.6250 mg | Freq: Once | INTRAMUSCULAR | Status: DC | PRN

## 2024-08-14 MED ORDER — ACETAMINOPHEN 500 MG PO TABS
ORAL_TABLET | ORAL | Status: AC
  Filled 2024-08-14: qty 2

## 2024-08-14 MED ORDER — LIDOCAINE HCL (PF) 2 % IJ SOLN
INTRAMUSCULAR | Status: AC
  Filled 2024-08-14: qty 5

## 2024-08-14 MED ORDER — FENTANYL CITRATE (PF) 100 MCG/2ML IJ SOLN: 25.0000 ug | INTRAMUSCULAR | Status: DC | PRN

## 2024-08-14 MED ORDER — POVIDONE-IODINE 10 % EX SWAB: 2.0000 | Freq: Once | CUTANEOUS | Status: DC

## 2024-08-14 MED ORDER — LACTATED RINGERS IR SOLN
Status: DC | PRN
  Administered 2024-08-14: 1200 mL

## 2024-08-14 MED ORDER — PROPOFOL 1000 MG/100ML IV EMUL
INTRAVENOUS | Status: AC
  Filled 2024-08-14: qty 100

## 2024-08-14 MED ORDER — ORAL CARE MOUTH RINSE: 15.0000 mL | Freq: Once | OROMUCOSAL | Status: AC

## 2024-08-14 MED ORDER — PROPOFOL 10 MG/ML IV BOLUS
INTRAVENOUS | Status: DC | PRN
  Administered 2024-08-14: 50 ug/kg/min via INTRAVENOUS
  Administered 2024-08-14: 200 mg via INTRAVENOUS

## 2024-08-14 NOTE — Transfer of Care (Signed)
 Immediate Anesthesia Transfer of Care Note  Patient: Kristi Dominguez  Procedure(s) Performed: DILATATION AND CURETTAGE /HYSTEROSCOPY (Cervix) INSERTION, INTRAUTERINE DEVICE (Uterus)  Patient Location: PACU  Anesthesia Type:General  Level of Consciousness: drowsy  Airway & Oxygen Therapy: Patient Spontanous Breathing and Patient connected to nasal cannula oxygen  Post-op Assessment: Report given to RN and Post -op Vital signs reviewed and stable  Post vital signs: stable  Last Vitals:  Vitals Value Taken Time  BP 141/75 08/14/24 11:30  Temp 36.1 C 08/14/24 11:30  Pulse 72 08/14/24 11:36  Resp 14 08/14/24 11:36  SpO2 100 % 08/14/24 11:36  Vitals shown include unfiled device data.  Last Pain:  Vitals:   08/14/24 1130  PainSc: Asleep         Complications: There were no known notable events for this encounter.

## 2024-08-14 NOTE — Anesthesia Preprocedure Evaluation (Signed)
"                                    Anesthesia Evaluation  Patient identified by MRN, date of birth, ID band Patient awake    Reviewed: Allergy & Precautions, H&P , NPO status , Patient's Chart, lab work & pertinent test results, reviewed documented beta blocker date and time   Airway Mallampati: I  TM Distance: >3 FB Neck ROM: full    Dental  (+) Dental Advidsory Given, Caps, Teeth Intact   Pulmonary neg pulmonary ROS   Pulmonary exam normal breath sounds clear to auscultation       Cardiovascular Exercise Tolerance: Good hypertension, (-) angina (-) Past MI and (-) Cardiac Stents Normal cardiovascular exam(-) dysrhythmias (-) Valvular Problems/Murmurs Rhythm:regular Rate:Normal     Neuro/Psych  PSYCHIATRIC DISORDERS  Depression    negative neurological ROS     GI/Hepatic negative GI ROS, Neg liver ROS,,,  Endo/Other  negative endocrine ROS    Renal/GU negative Renal ROS  negative genitourinary   Musculoskeletal   Abdominal   Peds  Hematology negative hematology ROS (+)   Anesthesia Other Findings Past Medical History: No date: Depression 2004: Dysplastic nevus     Comment:  pubic area - treated in [place] unsure of exact year: Dysplastic nevus     Comment:  back, R hand, R foot, legs - treated in [place] 05/17/2020: Dysplastic nevus     Comment:  L upper med thigh - mod 05/17/2020: Dysplastic nevus     Comment:  R lat scapula - mod No date: Eczema No date: Endometrial polyp No date: History of heavy vaginal bleeding No date: Hypertension No date: Migraine No date: Uterine fibroid   Reproductive/Obstetrics negative OB ROS                              Anesthesia Physical Anesthesia Plan  ASA: 2  Anesthesia Plan: General   Post-op Pain Management:    Induction: Intravenous  PONV Risk Score and Plan: 3 and Ondansetron , Dexamethasone , Midazolam  and Treatment may vary due to age or medical condition  Airway  Management Planned: LMA and Oral ETT  Additional Equipment:   Intra-op Plan:   Post-operative Plan: Extubation in OR  Informed Consent: I have reviewed the patients History and Physical, chart, labs and discussed the procedure including the risks, benefits and alternatives for the proposed anesthesia with the patient or authorized representative who has indicated his/her understanding and acceptance.     Dental Advisory Given  Plan Discussed with: Anesthesiologist, CRNA and Surgeon  Anesthesia Plan Comments:         Anesthesia Quick Evaluation  "

## 2024-08-14 NOTE — Anesthesia Procedure Notes (Signed)
 Procedure Name: LMA Insertion Date/Time: 08/14/2024 10:35 AM  Performed by: Governor Mayo B, CRNAPre-anesthesia Checklist: Patient identified, Emergency Drugs available, Suction available, Patient being monitored and Timeout performed Patient Re-evaluated:Patient Re-evaluated prior to induction Oxygen Delivery Method: Circle system utilized Preoxygenation: Pre-oxygenation with 100% oxygen Induction Type: IV induction Ventilation: Mask ventilation without difficulty LMA: LMA with gastric port inserted LMA Size: 4.0 Number of attempts: 1 Dental Injury: Teeth and Oropharynx as per pre-operative assessment

## 2024-08-14 NOTE — Op Note (Addendum)
 Operative Report Hysteroscopy with Dilation and Curettage   Indications: abnormal uterine bleeding   Pre-operative Diagnosis: thickened endometrial stripe   Post-operative Diagnosis: same.  Procedure: 1. Exam under anesthesia 2. Fractional D&C 3. Hysteroscopy 4. IUD placement  Surgeon: Heather Penton, MD  Assistant(s):  None  Anesthesia: General LMA anesthesia  Anesthesiologist: No responsible provider has been recorded for the case. Anesthesiologist: Dario Barter, MD CRNA: Governor Selinda NOVAK, CRNA  Estimated Blood Loss:  Minimal  Total IV Fluids:  Urine Output: 30ml  Total Fluid Deficit:  70 mL          Specimens: Endocervical curettings, endometrial curettings         Complications:  None; patient tolerated the procedure well.         Disposition: PACU - hemodynamically stable.         Condition: stable  Findings: Uterus measuring 10.5 cm by sound; normal cervix, vagina, perineum. Mildly proliferative endometrium, but no intracavitary fibroid or polyp was visualized.  Indication for procedure/Consents: 59 y.o. F here for scheduled surgery for the aforementioned diagnoses.     Risks of surgery were discussed with the patient including but not limited to: bleeding which may require transfusion; infection which may require antibiotics; injury to uterus or surrounding organs; intrauterine scarring which may impair future fertility; need for additional procedures including laparotomy or laparoscopy; and other postoperative/anesthesia complications. Written informed consent was obtained.    Procedure Details:   D&C/ Myosure  The patient was taken to the operating room where anesthesia was administered and was found to be adequate.  After a formal and adequate timeout was performed, she was placed in the dorsal lithotomy position and examined with the above findings. She was then prepped and draped in the sterile manner.   Her bladder was catheterized for an  estimated amount of clear, yellow urine. A weighed speculum was then placed in the patient's vagina and a single tooth tenaculum was applied to the anterior lip of the cervix.  An endocervical curette was performed. Her cervix was serially dilated to 15 French using Hanks dilators. The hysteroscope was introduced under direct observation  Using lactated ringers as a distention medium to reveal the above findings. The uterine cavity was carefully examined, both ostia were recognized, and diffusely mildly proliferative endometrium were noted. After further careful visualization of the uterine cavity, the hysteroscope was removed under direct visualization.  A sharp curettage was then performed until there was a gritty texture in all four quadrants.   The Mirena IUD was placed without complication and strings cut to 4cm. The tenaculum was removed from the anterior lip of the cervix and the vaginal speculum was removed after applying silver nitrate/pressure for good hemostasis.   The patient tolerated the procedure well and was taken to the recovery area awake and in stable condition. She received iv acetaminophen  and Toradol  prior to leaving the OR.  The patient will be discharged to home as per PACU criteria. Routine postoperative instructions given.  She was prescribed Ibuprofen and Colace.  She will follow up in the clinic in two weeks for postoperative evaluation.

## 2024-08-14 NOTE — Discharge Instructions (Signed)
 Discharge instructions after a hysteroscopy with dilation and curettage  Signs and Symptoms to Report  Call our office at [phone] if you have any of the following:    Fever over 100.4 degrees or higher  Severe stomach pain not relieved with pain medications  Bright red bleeding that's heavier than a period that does not slow with rest after the first 24 hours  To go the bathroom a lot (frequency), you can't hold your urine (urgency), or it hurts when you empty your bladder (urinate)  Chest pain  Shortness of breath  Pain in the calves of your legs  Severe nausea and vomiting not relieved with anti-nausea medications  Any concerns  What You Can Expect after Surgery  You may see some pink tinged, bloody fluid. This is normal. You may also have cramping for several days.   Activities after Your Discharge Follow these guidelines to help speed your recovery at home:  Don't drive if you are in pain or taking narcotic pain medicine. You may drive when you can safely slam on the brakes, turn the wheel forcefully, and rotate your torso comfortably. This is typically 4-7 days. Practice in a parking lot or side street prior to attempting to drive regularly.   Ask others to help with household chores for 4 weeks.  Don't do strenuous activities, exercises, or sports like vacuuming, tennis, squash, etc. until your doctor says it is safe to do so.  Walk as you feel able. Rest often since it may take a week or two for your energy level to return to normal.   You may climb stairs  Avoid constipation:   -Eat fruits, vegetables, and whole grains. Eat small meals as your appetite will take time to return to normal.   -Drink 6 to 8 glasses of water each day unless your doctor has told you to limit your fluids.   -Use a laxative or stool softener as needed if constipation becomes a problem. You may take Miralax, metamucil, Citrucil, Colace, Senekot, FiberCon, etc. If this does not relieve the  constipation, try two tablespoons of Milk Of Magnesia every 8 hours until your bowels move.   You may shower.   Do not get in a hot tub, swimming pool, etc. until your doctor agrees.  Do not douche, use tampons, or have sex until your doctor says it is okay, usually about 2 weeks.  Take your pain medicine when you need it. The medicine may not work as well if the pain is bad.  Take the medicines you were taking before surgery. Other medications you might need are pain medications (ibuprofen), medications for constipation (Colace) and nausea medications (Zofran).

## 2024-08-14 NOTE — Interval H&P Note (Signed)
 History and Physical Interval Note:  08/14/2024 9:46 AM  Kristi Dominguez  has presented today for surgery, with the diagnosis of postmenopausal bleeding.  The various methods of treatment have been discussed with the patient and family. After consideration of risks, benefits and other options for treatment, the patient has consented to  Procedures: DILATATION AND CURETTAGE /HYSTEROSCOPY (N/A) POLYPECTOMY, CERVIX (N/A) and placement of progesterone IUD as a surgical intervention.  The patient's history has been reviewed, patient examined, no change in status, stable for surgery.  I have reviewed the patient's chart and labs.  Questions were answered to the patient's satisfaction.     Heather Penton

## 2024-08-17 ENCOUNTER — Encounter: Payer: Self-pay | Admitting: Obstetrics and Gynecology

## 2024-08-17 LAB — SURGICAL PATHOLOGY

## 2025-06-21 ENCOUNTER — Encounter: Admitting: Dermatology
